# Patient Record
Sex: Female | Born: 1970
Health system: Southern US, Community
[De-identification: ages and names within clinical notes are randomized; demographics above are authoritative.]

## PROBLEM LIST (undated history)

## (undated) DIAGNOSIS — K219 Gastro-esophageal reflux disease without esophagitis: Secondary | ICD-10-CM

## (undated) DIAGNOSIS — F32A Depression, unspecified: Secondary | ICD-10-CM

## (undated) DIAGNOSIS — D649 Anemia, unspecified: Secondary | ICD-10-CM

## (undated) DIAGNOSIS — F419 Anxiety disorder, unspecified: Secondary | ICD-10-CM

## (undated) DIAGNOSIS — R7303 Prediabetes: Secondary | ICD-10-CM

## (undated) DIAGNOSIS — N200 Calculus of kidney: Secondary | ICD-10-CM

## (undated) HISTORY — DX: Gastro-esophageal reflux disease without esophagitis: K21.9

## (undated) HISTORY — DX: Anemia, unspecified: D64.9

## (undated) HISTORY — DX: Anxiety disorder, unspecified: F41.9

## (undated) HISTORY — PX: TUBAL LIGATION: SHX77

## (undated) HISTORY — DX: Prediabetes: R73.03

## (undated) HISTORY — DX: Depression, unspecified: F32.A

---

## 2016-03-20 DIAGNOSIS — F329 Major depressive disorder, single episode, unspecified: Secondary | ICD-10-CM | POA: Insufficient documentation

## 2016-03-20 DIAGNOSIS — F32A Depression, unspecified: Secondary | ICD-10-CM | POA: Insufficient documentation

## 2016-03-20 DIAGNOSIS — E785 Hyperlipidemia, unspecified: Secondary | ICD-10-CM | POA: Insufficient documentation

## 2016-03-20 DIAGNOSIS — E119 Type 2 diabetes mellitus without complications: Secondary | ICD-10-CM | POA: Insufficient documentation

## 2016-03-20 DIAGNOSIS — F3341 Major depressive disorder, recurrent, in partial remission: Secondary | ICD-10-CM | POA: Insufficient documentation

## 2016-03-20 DIAGNOSIS — F419 Anxiety disorder, unspecified: Secondary | ICD-10-CM | POA: Insufficient documentation

## 2017-01-29 ENCOUNTER — Ambulatory Visit
Admission: EM | Admit: 2017-01-29 | Discharge: 2017-01-29 | Disposition: A | Discharge status: Home / Self Care | Payer: Self-pay | Attending: Emergency Medicine | Admitting: Emergency Medicine

## 2017-01-29 ENCOUNTER — Encounter: Payer: Self-pay | Admitting: *Deleted

## 2017-01-29 DIAGNOSIS — R059 Cough, unspecified: Secondary | ICD-10-CM

## 2017-01-29 DIAGNOSIS — R05 Cough: Secondary | ICD-10-CM

## 2017-01-29 DIAGNOSIS — J0110 Acute frontal sinusitis, unspecified: Secondary | ICD-10-CM

## 2017-01-29 HISTORY — DX: Calculus of kidney: N20.0

## 2017-01-29 LAB — RAPID STREP SCREEN (MED CTR MEBANE ONLY): Streptococcus, Group A Screen (Direct): NEGATIVE

## 2017-01-29 MED ORDER — ALBUTEROL SULFATE HFA 108 (90 BASE) MCG/ACT IN AERS: 2.0000 | INHALATION_SPRAY | Freq: Four times a day (QID) | RESPIRATORY_TRACT | 0 refills | Status: DC | PRN

## 2017-01-29 MED ORDER — AMOXICILLIN-POT CLAVULANATE 875-125 MG PO TABS: 1.0000 | ORAL_TABLET | Freq: Two times a day (BID) | ORAL | 0 refills | Status: AC

## 2017-01-29 MED ORDER — BENZONATATE 100 MG PO CAPS: 100.0000 mg | ORAL_CAPSULE | Freq: Three times a day (TID) | ORAL | 0 refills | Status: DC | PRN

## 2017-01-29 MED ORDER — HYDROCOD POLST-CPM POLST ER 10-8 MG/5ML PO SUER: 5.0000 mL | Freq: Every evening | ORAL | 0 refills | Status: DC | PRN

## 2017-01-29 NOTE — ED Triage Notes (Signed)
Productive cough- clear, sore throat, chills, body aches, fever, x1 week.

## 2017-01-29 NOTE — ED Provider Notes (Signed)
CSN: [id number]     Arrival date & time 01/29/17  1727 History   First MD Initiated Contact with Patient 01/29/17 1758     Chief Complaint  Patient presents with  . Cough  . Fever  . Chills  . Generalized Body Aches  . Sore Throat   (Consider location/radiation/quality/duration/timing/severity/associated sxs/prior Treatment) 46 year old female presents with nasal congestion and coughing for over 1 week. Started with fever, sore throat and body aches which resolved within the first 3 days. Now having more sinus pressure and headaches, coughing with clear to discolored mucus and chest tightness/wheezing. Denies any GI symptoms. Has tried Advil and Nyquil with no relief. Having difficulty sleeping at night due to cough. No other family members ill. Has used Albuterol inhaler in the past when she has been ill with good success but no definitive history of allergies/asthma. Does not smoke. No other chronic health issues. Takes no daily medication.    The history is provided by the patient.    Past Medical History:  Diagnosis Date  . Kidney stone    History reviewed. No pertinent surgical history. History reviewed. No pertinent family history. Social History  Substance Use Topics  . Smoking status: Never Smoker  . Smokeless tobacco: Never Used  . Alcohol use Yes   OB History    No data available     Review of Systems  Constitutional: Positive for fatigue and fever. Negative for activity change, appetite change and chills.  HENT: Positive for congestion, postnasal drip, rhinorrhea, sinus pressure and sore throat. Negative for ear discharge, ear pain, facial swelling, mouth sores, nosebleeds, sinus pain, sneezing and trouble swallowing.   Eyes: Negative for discharge and itching.  Respiratory: Positive for cough, chest tightness and wheezing. Negative for shortness of breath.   Cardiovascular: Negative for chest pain.  Gastrointestinal: Negative for abdominal pain, diarrhea, nausea  and vomiting.  Musculoskeletal: Negative for arthralgias, back pain, myalgias, neck pain and neck stiffness.  Skin: Negative for rash and wound.  Allergic/Immunologic: Negative for immunocompromised state.  Neurological: Positive for headaches. Negative for dizziness, syncope, weakness, light-headedness and numbness.  Hematological: Negative for adenopathy. Does not bruise/bleed easily.    Allergies  Patient has no known allergies.  Home Medications   Prior to Admission medications   Medication Sig Start Date End Date Taking? Authorizing Provider  albuterol (PROVENTIL HFA;VENTOLIN HFA) 108 (90 Base) MCG/ACT inhaler Inhale 2 puffs into the lungs every 6 (six) hours as needed for wheezing or shortness of breath. 01/29/17   Sudie GrumblingAmyot, Tamani Durney Berry, NP  amoxicillin-clavulanate (AUGMENTIN) 875-125 MG tablet Take 1 tablet by mouth every 12 (twelve) hours. 01/29/17 02/05/17  Sudie GrumblingAmyot, Caila Cirelli Berry, NP  benzonatate (TESSALON) 100 MG capsule Take 1 capsule (100 mg total) by mouth 3 (three) times daily as needed for cough. 01/29/17   Sudie GrumblingAmyot, Joelene Barriere Berry, NP  chlorpheniramine-HYDROcodone (TUSSIONEX PENNKINETIC ER) 10-8 MG/5ML SUER Take 5 mLs by mouth at bedtime as needed for cough. 01/29/17   Sudie GrumblingAmyot, Shepard Keltz Berry, NP   Meds Ordered and Administered this Visit  Medications - No data to display  BP 121/78 (BP Location: Left Arm)   Pulse 94   Temp 98.4 F (36.9 C) (Oral)   Resp 16   Ht 5\' 2"  (1.575 m)   Wt 203 lb (92.1 kg)   LMP 01/07/2017 (Exact Date)   SpO2 97%   BMI 37.13 kg/m  No data found.   Physical Exam  Constitutional: She is oriented to person, place, and time. Vital  signs are normal. She appears well-developed and well-nourished. No distress.  HENT:  Head: Normocephalic and atraumatic.  Right Ear: Hearing, external ear and ear canal normal. Tympanic membrane is bulging.  Left Ear: Hearing, external ear and ear canal normal. Tympanic membrane is bulging.  Nose: Mucosal edema and rhinorrhea present.  Right sinus exhibits frontal sinus tenderness. Right sinus exhibits no maxillary sinus tenderness. Left sinus exhibits frontal sinus tenderness. Left sinus exhibits no maxillary sinus tenderness.  Mouth/Throat: Uvula is midline and mucous membranes are normal. Posterior oropharyngeal erythema present.  Neck: Normal range of motion. Neck supple.  Cardiovascular: Normal rate, regular rhythm and normal heart sounds.   No murmur heard. Pulmonary/Chest: Effort normal. No respiratory distress. She has decreased breath sounds in the right upper field, the right lower field, the left upper field and the left lower field. She has wheezes in the right upper field and the left upper field. She has no rhonchi. She has no rales.  Musculoskeletal: Normal range of motion.  Lymphadenopathy:    She has no cervical adenopathy.  Neurological: She is alert and oriented to person, place, and time.  Skin: Skin is warm and dry.  Psychiatric: She has a normal mood and affect. Her behavior is normal. Judgment and thought content normal.    Urgent Care Course     Procedures (including critical care time)  Labs Review Labs Reviewed  RAPID STREP SCREEN (NOT AT [facility])  CULTURE, GROUP A STREP [facility])    Imaging Review No results found.   Visual Acuity Review  Right Eye Distance:   Left Eye Distance:   Bilateral Distance:    Right Eye Near:   Left Eye Near:    Bilateral Near:         MDM   1. Acute non-recurrent frontal sinusitis   2. Cough    Reviewed negative rapid strep test result with patient. Discussed that she probably has a bacterial sinus infection and cough due to irritation from drainage. Recommend start Augmentin 875mg  twice a day as directed. May take Tessalon cough pills 1 every 8 hours as needed. May use Tussionex 1 teaspoon at night to help with cough. [place] Controlled Substance Registry Reviewed- no active Rx in the past 6 months. Recommend increase fluid intake to help loosen up mucus.  Use Albuterol inhaler 2 puffs every 6 hours as needed. Follow-up in 4 to 5 days if not improving.      Sudie Grumbling, NP 01/29/17 [phone]

## 2017-01-29 NOTE — Discharge Instructions (Signed)
Recommend start Augmentin 875mg  twice a day as directed. May take Tessalon cough pills 1 every 8 hours as needed. May use Tussionex 1 teaspoon at night to help with cough. Increase fluid intake to help loosen up mucus. Use Albuterol inhaler 2 puffs every 6 hours as needed. Follow-up in 4 to 5 days if not improving.

## 2017-02-01 LAB — CULTURE, GROUP A STREP (THRC)

## 2018-11-09 ENCOUNTER — Ambulatory Visit: Payer: Self-pay | Admitting: Family Medicine

## 2018-11-10 ENCOUNTER — Ambulatory Visit: Payer: Self-pay | Admitting: Family Medicine

## 2019-01-20 ENCOUNTER — Telehealth: Payer: Self-pay | Admitting: Family Medicine

## 2019-01-20 ENCOUNTER — Ambulatory Visit: Payer: BC Managed Care – PPO | Admitting: Family Medicine

## 2019-01-20 ENCOUNTER — Encounter: Payer: Self-pay | Admitting: Family Medicine

## 2019-01-20 ENCOUNTER — Ambulatory Visit
Admission: RE | Admit: 2019-01-20 | Discharge: 2019-01-20 | Disposition: A | Discharge status: Home / Self Care | Payer: BC Managed Care – PPO | Source: Ambulatory Visit | Attending: Family Medicine | Admitting: Family Medicine

## 2019-01-20 ENCOUNTER — Other Ambulatory Visit: Payer: Self-pay

## 2019-01-20 ENCOUNTER — Ambulatory Visit
Admission: RE | Admit: 2019-01-20 | Discharge: 2019-01-20 | Disposition: A | Discharge status: Home / Self Care | Payer: BC Managed Care – PPO | Attending: Family Medicine | Admitting: Family Medicine

## 2019-01-20 VITALS — BP 110/80 | HR 80 | Ht 62.0 in | Wt 224.0 lb

## 2019-01-20 DIAGNOSIS — E119 Type 2 diabetes mellitus without complications: Secondary | ICD-10-CM

## 2019-01-20 DIAGNOSIS — F419 Anxiety disorder, unspecified: Secondary | ICD-10-CM | POA: Diagnosis not present

## 2019-01-20 DIAGNOSIS — Z7689 Persons encountering health services in other specified circumstances: Secondary | ICD-10-CM | POA: Diagnosis not present

## 2019-01-20 DIAGNOSIS — M19072 Primary osteoarthritis, left ankle and foot: Secondary | ICD-10-CM | POA: Diagnosis not present

## 2019-01-20 DIAGNOSIS — M779 Enthesopathy, unspecified: Secondary | ICD-10-CM | POA: Insufficient documentation

## 2019-01-20 MED ORDER — MELOXICAM 7.5 MG PO TABS: 7.5000 mg | ORAL_TABLET | Freq: Every day | ORAL | 0 refills | Status: DC

## 2019-01-20 NOTE — Progress Notes (Signed)
Date:  01/20/2019   Name:  Beth [name]   DOB:  12/29/1970   MRN:  [id number]   Chief Complaint: Establish Care (hasn't had insurance); Anxiety (referral to psych- PHQ9=5 GAD7=13); and Foot Pain (hurts across top of foot)  Patient is a 48 year old female who presents for an establish care exam. The patient reports the following problems: anxiety/foot pain. Health maintenance has been reviewed up to date.  Anxiety  Presents for follow-up visit. Symptoms include excessive worry, nervous/anxious behavior and panic. Patient reports no chest pain, compulsions, confusion, decreased concentration, depressed mood, dizziness, dry mouth, feeling of choking, hyperventilation, impotence, insomnia, irritability, malaise, muscle tension, nausea, obsessions, palpitations, restlessness, shortness of breath or suicidal ideas. Symptoms occur occasionally. The severity of symptoms is moderate.    Foot Pain  This is a new (talked into hiking) problem. The current episode started more than 1 month ago (about a month ago). The problem occurs constantly. The problem has been unchanged (not getting better). Associated symptoms include arthralgias. Pertinent negatives include no abdominal pain, chest pain, chills, congestion, coughing, fatigue, fever, headaches, myalgias, nausea, numbness, rash, sore throat, vomiting or weakness.    Review of Systems  Constitutional: Negative.  Negative for chills, fatigue, fever, irritability and unexpected weight change.  HENT: Negative for congestion, ear discharge, ear pain, rhinorrhea, sinus pressure, sneezing and sore throat.   Eyes: Negative for photophobia, pain, discharge, redness and itching.  Respiratory: Negative for cough, shortness of breath, wheezing and stridor.   Cardiovascular: Negative for chest pain and palpitations.  Gastrointestinal: Negative for abdominal pain, blood in stool, constipation, diarrhea, nausea and vomiting.  Endocrine: Negative for cold  intolerance, heat intolerance, polydipsia, polyphagia and polyuria.  Genitourinary: Negative for dysuria, flank pain, frequency, hematuria, impotence, menstrual problem, pelvic pain, urgency, vaginal bleeding and vaginal discharge.  Musculoskeletal: Positive for arthralgias. Negative for back pain and myalgias.  Skin: Negative for rash.  Allergic/Immunologic: Negative for environmental allergies and food allergies.  Neurological: Negative for dizziness, weakness, light-headedness, numbness and headaches.  Hematological: Negative for adenopathy. Does not bruise/bleed easily.  Psychiatric/Behavioral: Negative for confusion, decreased concentration, dysphoric mood and suicidal ideas. The patient is nervous/anxious. The patient does not have insomnia.     Patient Active Problem List   Diagnosis Date Noted  . Anxiety 03/20/2016  . Depression 03/20/2016  . Diabetes mellitus type 2, uncomplicated (HCC) 03/20/2016  . Hyperlipidemia, unspecified 03/20/2016    No Known Allergies  History reviewed. No pertinent surgical history.  Social History   Tobacco Use  . Smoking status: Never Smoker  . Smokeless tobacco: Never Used  Substance Use Topics  . Alcohol use: Yes  . Drug use: No     Medication list has been reviewed and updated.  Current Meds  Medication Sig  . albuterol (PROVENTIL HFA;VENTOLIN HFA) 108 (90 Base) MCG/ACT inhaler Inhale 2 puffs into the lungs every 6 (six) hours as needed for wheezing or shortness of breath.  . clonazePAM (KLONOPIN) 0.5 MG tablet Take 1 tablet by mouth as needed.    PHQ 2/9 Scores 01/20/2019  PHQ - 2 Score 2  PHQ- 9 Score 5    BP Readings from Last 3 Encounters:  01/20/19 110/80  01/29/17 121/78    Physical Exam  Wt Readings from Last 3 Encounters:  01/20/19 224 lb (101.6 kg)  01/29/17 203 lb (92.1 kg)    BP 110/80   Pulse 80   Ht 5\' 2"  (1.575 m)   Wt 224 lb (101.6 kg)  LMP 11/24/2018 (Approximate) Comment: irregular periods  BMI  40.97 kg/m   Assessment and Plan:  1. Establishing care with new doctor, encounter for Establishment of care with new physician.  Previous encounters were reviewed as well as labs and imaging.  2. Type 2 diabetes mellitus without complication, without long-term current use of insulin (HCC) Patient has a history of prediabetes we will check an A1c for baseline and then address accordingly. - Hemoglobin A1c  3. Tendonitis After a walk of about 3 to 5 miles patient sustained some discomfort of her left foot across the dorsum of her foot and her ankle.  This is been persistent for several weeks.  It is unlikely there is a fracture but there may be a stress fracture given that patient is overweight and this was over uneven terrain.  Will check an x-ray to rule out fracture and initiate meloxicam. - DG Foot Complete Left; Future - meloxicam (MOBIC) 7.5 MG tablet; Take 1 tablet (7.5 mg total) by mouth daily.  Dispense: 30 tablet; Refill: 0  4. Anxiety Patient has a history of anxiety with a gad 7 score of 13.  Will refer to psychiatry for evaluation and treatment.  Told patient it would be unlikely that we would sustain a long-term benzodiazepine and the possibility of a SSRI is likely.  And has tolerated Prozac in the past but stopped on her own. - Ambulatory referral to Psychiatry

## 2019-01-20 NOTE — Telephone Encounter (Signed)
Not until we get her foot in better shape, all she will do is aggravate it

## 2019-01-20 NOTE — Telephone Encounter (Signed)
Patient would like to know if she can go walking.

## 2019-01-21 LAB — HEMOGLOBIN A1C
Est. average glucose Bld gHb Est-mCnc: 114 mg/dL
Hgb A1c MFr Bld: 5.6 % (ref 4.8–5.6)

## 2019-02-08 DIAGNOSIS — F331 Major depressive disorder, recurrent, moderate: Secondary | ICD-10-CM | POA: Diagnosis not present

## 2019-02-08 DIAGNOSIS — F411 Generalized anxiety disorder: Secondary | ICD-10-CM | POA: Diagnosis not present

## 2019-03-08 DIAGNOSIS — F411 Generalized anxiety disorder: Secondary | ICD-10-CM | POA: Diagnosis not present

## 2019-03-08 DIAGNOSIS — F331 Major depressive disorder, recurrent, moderate: Secondary | ICD-10-CM | POA: Diagnosis not present

## 2019-04-14 ENCOUNTER — Other Ambulatory Visit: Payer: Self-pay | Admitting: Family Medicine

## 2019-04-14 DIAGNOSIS — M779 Enthesopathy, unspecified: Secondary | ICD-10-CM

## 2019-05-15 ENCOUNTER — Other Ambulatory Visit: Payer: Self-pay | Admitting: Family Medicine

## 2019-05-15 DIAGNOSIS — M779 Enthesopathy, unspecified: Secondary | ICD-10-CM

## 2019-05-18 DIAGNOSIS — F331 Major depressive disorder, recurrent, moderate: Secondary | ICD-10-CM | POA: Diagnosis not present

## 2019-05-18 DIAGNOSIS — F411 Generalized anxiety disorder: Secondary | ICD-10-CM | POA: Diagnosis not present

## 2019-05-24 DIAGNOSIS — F331 Major depressive disorder, recurrent, moderate: Secondary | ICD-10-CM | POA: Diagnosis not present

## 2019-05-24 DIAGNOSIS — F411 Generalized anxiety disorder: Secondary | ICD-10-CM | POA: Diagnosis not present

## 2020-01-25 ENCOUNTER — Ambulatory Visit
Admission: EM | Admit: 2020-01-25 | Discharge: 2020-01-25 | Disposition: A | Discharge status: Home / Self Care | Payer: BC Managed Care – PPO | Attending: Emergency Medicine | Admitting: Emergency Medicine

## 2020-01-25 ENCOUNTER — Other Ambulatory Visit: Payer: Self-pay

## 2020-01-25 DIAGNOSIS — R059 Cough, unspecified: Secondary | ICD-10-CM

## 2020-01-25 DIAGNOSIS — K219 Gastro-esophageal reflux disease without esophagitis: Secondary | ICD-10-CM

## 2020-01-25 DIAGNOSIS — J019 Acute sinusitis, unspecified: Secondary | ICD-10-CM | POA: Diagnosis not present

## 2020-01-25 DIAGNOSIS — R05 Cough: Secondary | ICD-10-CM | POA: Diagnosis not present

## 2020-01-25 MED ORDER — HYDROCOD POLST-CPM POLST ER 10-8 MG/5ML PO SUER: 5.0000 mL | Freq: Two times a day (BID) | ORAL | 0 refills | Status: DC | PRN

## 2020-01-25 MED ORDER — AMOXICILLIN-POT CLAVULANATE 875-125 MG PO TABS: 1.0000 | ORAL_TABLET | Freq: Two times a day (BID) | ORAL | 0 refills | Status: DC

## 2020-01-25 MED ORDER — FLUTICASONE PROPIONATE 50 MCG/ACT NA SUSP: 2.0000 | Freq: Every day | NASAL | 0 refills | Status: DC

## 2020-01-25 MED ORDER — FAMOTIDINE 20 MG PO TABS: 20.0000 mg | ORAL_TABLET | Freq: Two times a day (BID) | ORAL | 0 refills | Status: DC

## 2020-01-25 MED ORDER — FAMOTIDINE 20 MG PO TABS: 20.0000 mg | ORAL_TABLET | Freq: Two times a day (BID) | ORAL | 0 refills | Status: AC

## 2020-01-25 NOTE — ED Triage Notes (Signed)
Patient complains of cough, nasal congestion x 2 weeks ago. Patient states that cough is worse at night.

## 2020-01-25 NOTE — ED Provider Notes (Signed)
HPI  SUBJECTIVE:  Beth [name] is a 49 y.o. female who presents with URI symptoms for the past 2 weeks.  She reports nonproductive cough, chest tightness, wheezing worse at night.  She reports postnasal drip, headache, burning sore throat secondary to the cough.  One episode of posttussive emesis.  She reports having GERD symptoms at night.  She reports greenish nasal discharge streaked with blood.  No fevers, sinus pain or pressure, facial swelling, upper dental pain, body aches, loss of sense of smell or taste, shortness of breath, nausea, diarrhea, abdominal pain.  No allergy symptoms.  No antibiotics in the past month.  No antipyretic in the past 4 to 6 hours.  States that she is unable to sleep at night secondary to the cough.  States that she was getting better but then got acutely worse.  She has been taking NyQuil and DayQuil with temporary relief.  Symptoms are worse with lying down at night.  She has a past medical history of GERD and is not on any medications for this.  No history of allergies, frequent sinusitis, diabetes, hypertension, pulmonary disease, smoking.  LMP: 5/16.  Denies the possibility being pregnant.  PMD: None.    Past Medical History:  Diagnosis Date  . Anxiety   . Borderline diabetes   . Kidney stone    kidney stone    History reviewed. No pertinent surgical history.  Family History  Problem Relation Age of Onset  . Diabetes Mother   . Hypertension Mother   . Diabetes Father   . Hypertension Father   . Diabetes Maternal Grandmother   . Heart disease Maternal Grandmother   . Diabetes Maternal Grandfather   . Diabetes Paternal Grandmother   . Diabetes Paternal Grandfather     Social History   Tobacco Use  . Smoking status: Never Smoker  . Smokeless tobacco: Never Used  Substance Use Topics  . Alcohol use: Yes    Comment: occasionally  . Drug use: No    No current facility-administered medications for this encounter.  Current Outpatient  Medications:  .  amoxicillin-clavulanate (AUGMENTIN) 875-125 MG tablet, Take 1 tablet by mouth 2 (two) times daily. X 7 days, Disp: 14 tablet, Rfl: 0 .  chlorpheniramine-HYDROcodone (TUSSIONEX PENNKINETIC ER) 10-8 MG/5ML SUER, Take 5 mLs by mouth every 12 (twelve) hours as needed for cough., Disp: 60 mL, Rfl: 0 .  famotidine (PEPCID) 20 MG tablet, Take 1 tablet (20 mg total) by mouth 2 (two) times daily., Disp: 60 tablet, Rfl: 0 .  fluticasone (FLONASE) 50 MCG/ACT nasal spray, Place 2 sprays into both nostrils daily., Disp: 16 g, Rfl: 0  No Known Allergies   ROS  As noted in HPI.   Physical Exam  BP 110/78 (BP Location: Left Arm)   Pulse 91   Temp 98.4 F (36.9 C) (Oral)   Resp 18   Ht 5\' 2"  (1.575 m)   Wt 102.1 kg   LMP 12/26/2019   SpO2 97%   BMI 41.15 kg/m   Constitutional: Well developed, well nourished, no acute distress Eyes:  EOMI, conjunctiva normal bilaterally HENT: Normocephalic, atraumatic,mucus membranes moist.  Erythematous, swollen turbinates.  Positive nasal congestion.  No maxillary, frontal sinus tenderness.  Normal oropharynx with no postnasal drip or cobblestoning. Respiratory: Normal inspiratory effort lungs clear bilaterally, good air movement.  No anterior lateral chest wall tenderness Cardiovascular: Normal rate regular rhythm no murmurs rubs or gallops GI: nondistended skin: No rash, skin intact Musculoskeletal: no deformities Neurologic: Alert & oriented  x 3, no focal neuro deficits Psychiatric: Speech and behavior appropriate   ED Course   Medications - No data to display  No orders of the defined types were placed in this encounter.   No results found for this or any previous visit (from the past 24 hour(s)). No results found.  ED Clinical Impression  1. Acute non-recurrent sinusitis, unspecified location   2. Cough   3. Gastroesophageal reflux disease, unspecified whether esophagitis present      ED Assessment/Plan  [place] Narcotic  database reviewed for this patient, and feel that the risk/benefit ratio today is favorable for proceeding with a prescription for controlled substance.  No opiate prescriptions in 2 years.  Suspect sinusitis versus acid reflux causing her cough.  She has had URI symptoms for the past 2 weeks and with a history of double sickening, will treat for sinusitis with Augmentin, Flonase, Mucinex D, saline nasal irrigation.  She also states her acid reflux is worse at night and her symptoms are worse at night. will start her on some Pepcid.  Tussionex for the cough at night.  Will provide primary care list for ongoing care.  Lungs are clear, no fevers, satting well room air.  Deferring chest x-ray.  Doubt Covid given duration of symptoms.  Discussed  MDM, treatment plan, and plan for follow-up with patient. Discussed sn/sx that should prompt return to the ED. patient agrees with plan.   Meds ordered this encounter  Medications  . fluticasone (FLONASE) 50 MCG/ACT nasal spray    Sig: Place 2 sprays into both nostrils daily.    Dispense:  16 g    Refill:  0  . amoxicillin-clavulanate (AUGMENTIN) 875-125 MG tablet    Sig: Take 1 tablet by mouth 2 (two) times daily. X 7 days    Dispense:  14 tablet    Refill:  0  . DISCONTD: famotidine (PEPCID) 20 MG tablet    Sig: Take 1 tablet (20 mg total) by mouth 2 (two) times daily.    Dispense:  60 tablet    Refill:  0  . chlorpheniramine-HYDROcodone (TUSSIONEX PENNKINETIC ER) 10-8 MG/5ML SUER    Sig: Take 5 mLs by mouth every 12 (twelve) hours as needed for cough.    Dispense:  60 mL    Refill:  0  . famotidine (PEPCID) 20 MG tablet    Sig: Take 1 tablet (20 mg total) by mouth 2 (two) times daily.    Dispense:  60 tablet    Refill:  0    *This clinic note was created using Scientist, clinical (histocompatibility and immunogenetics). Therefore, there may be occasional mistakes despite careful proofreading.   ?    Domenick Gong, MD 01/25/20 1050

## 2020-01-25 NOTE — Discharge Instructions (Addendum)
Augmentin, Flonase, Mucinex D, saline nasal irrigation with a Lloyd Huger med rinse and distilled water as often as you want for the sinusitis.  Pepcid for the acid reflux.  Mind your food choices and elevate the head of your bed 30 degrees.  Tussionex for the cough at night, but I think once we get your sinuses and acid reflux under control you will not need the Tussionex.  Here is a list of primary care providers who are taking new patients:  Dr. Elizabeth Sauer [address] [place] [address] [phone]  [facility] [address] [place] [address]  [phone]  [facility] [address] [place], [place] [address] [phone]  [facility] [address] [place]  [phone] [place], [place] [address]  Here are clinics/ other resources who will see you if you do not have insurance. Some have certain criteria that you must meet. Call them and find out what they are:  [facility]: [address] [place], [place] [address] Phone: [phone] Hours: First and Third Saturdays of each Month, 9 a.m. - 1 p.m.  [facility]: [address], [address] [place], [place] [address] Phone: [phone] Hours: Tuesday, 4 p.m. - 8 p.m. Thursday, 1 p.m. - 8 p.m. Wednesday, 9 a.m. - [facility] [address] [place], [place] [address] Phone: [phone] Pharmacy Phone Number: [phone] Dental Phone Number: [phone] [facility] Insurance Help: [phone]  Dental Hours: Monday - Thursday, 8 a.m. - 6 p.m.  [facility] [address] [place], [place] [address] Phone: [phone] Pharmacy Phone Number: [phone] [facility] Insurance Help: [phone]  [facility] [address] [place] [place], [place] [address] Phone: [phone] Pharmacy Phone Number: [phone] [facility] Insurance Help: [phone]  [facility] [address] [place], [place]  [address] Phone: [phone] [facility] Insurance Help: [phone]   [facility] [address] [place], [place] [address] Phone: [phone]  Go to www.goodrx.com to look up your medications. This will give you a list of where you can find your prescriptions at the most affordable prices. Or ask the pharmacist what the cash price is, or if they have any other discount programs available to help make your medication more affordable. This can be less expensive than what you would pay with insurance.

## 2020-04-26 ENCOUNTER — Ambulatory Visit: Payer: BC Managed Care – PPO

## 2020-04-26 ENCOUNTER — Encounter: Payer: Self-pay | Admitting: Sports Medicine

## 2020-04-26 ENCOUNTER — Ambulatory Visit: Payer: BC Managed Care – PPO | Admitting: Sports Medicine

## 2020-04-26 ENCOUNTER — Other Ambulatory Visit: Payer: Self-pay

## 2020-04-26 ENCOUNTER — Ambulatory Visit (INDEPENDENT_AMBULATORY_CARE_PROVIDER_SITE_OTHER): Payer: BC Managed Care – PPO

## 2020-04-26 ENCOUNTER — Other Ambulatory Visit: Payer: Self-pay | Admitting: Sports Medicine

## 2020-04-26 DIAGNOSIS — M722 Plantar fascial fibromatosis: Secondary | ICD-10-CM

## 2020-04-26 DIAGNOSIS — M7750 Other enthesopathy of unspecified foot: Secondary | ICD-10-CM

## 2020-04-26 DIAGNOSIS — M79672 Pain in left foot: Secondary | ICD-10-CM

## 2020-04-26 DIAGNOSIS — M778 Other enthesopathies, not elsewhere classified: Secondary | ICD-10-CM | POA: Diagnosis not present

## 2020-04-26 DIAGNOSIS — M25572 Pain in left ankle and joints of left foot: Secondary | ICD-10-CM | POA: Diagnosis not present

## 2020-04-26 DIAGNOSIS — M25571 Pain in right ankle and joints of right foot: Secondary | ICD-10-CM | POA: Diagnosis not present

## 2020-04-26 DIAGNOSIS — M779 Enthesopathy, unspecified: Secondary | ICD-10-CM | POA: Diagnosis not present

## 2020-04-26 DIAGNOSIS — M79671 Pain in right foot: Secondary | ICD-10-CM

## 2020-04-26 MED ORDER — DICLOFENAC SODIUM 75 MG PO TBEC: 75.0000 mg | DELAYED_RELEASE_TABLET | Freq: Two times a day (BID) | ORAL | 0 refills | Status: DC

## 2020-04-26 MED ORDER — PREDNISONE 10 MG (21) PO TBPK: ORAL_TABLET | ORAL | 0 refills | Status: DC

## 2020-04-26 NOTE — Progress Notes (Signed)
D

## 2020-04-26 NOTE — Progress Notes (Signed)
Subjective:  Beth [name] is a 49 y.o. female patient who presents to office for evaluation of left greater than right ankle pain. Patient complains of continued pain in the ankle that started since pain on the medial aspect that is worse as the day progresses reports that she cannot stand or walk more than 15 or 20 minutes due to the pain and cannot stretch in the mornings due to the pain in her ankle patient reports that since she has been dealing with pain for so long progressively getting worse over the last month now she is starting to have some pain also at her right ankle and her plantar fascia is also been hurting.  Patient reports that she has tried resting icing meloxicam in the past given by PCP that used to help but no longer helps elevating stretching rolling pain and trying to lose weight but her foot pain still persist. Patient denies any other pedal complaints. Denies injury/trip/fall/sprain/any other causative factors.   Review of Systems  All other systems reviewed and are negative.   Patient Active Problem List   Diagnosis Date Noted  . Anxiety 03/20/2016  . Depression 03/20/2016  . Diabetes mellitus type 2, uncomplicated (HCC) 03/20/2016  . Hyperlipidemia, unspecified 03/20/2016    Current Outpatient Medications on File Prior to Visit  Medication Sig Dispense Refill  . amoxicillin-clavulanate (AUGMENTIN) 875-125 MG tablet Take 1 tablet by mouth 2 (two) times daily. X 7 days 14 tablet 0  . chlorpheniramine-HYDROcodone (TUSSIONEX PENNKINETIC ER) 10-8 MG/5ML SUER Take 5 mLs by mouth every 12 (twelve) hours as needed for cough. 60 mL 0  . famotidine (PEPCID) 20 MG tablet Take 1 tablet (20 mg total) by mouth 2 (two) times daily. 60 tablet 0  . fluticasone (FLONASE) 50 MCG/ACT nasal spray Place 2 sprays into both nostrils daily. 16 g 0  . [DISCONTINUED] albuterol (PROVENTIL HFA;VENTOLIN HFA) 108 (90 Base) MCG/ACT inhaler Inhale 2 puffs into the lungs every 6 (six) hours as needed  for wheezing or shortness of breath. 1 Inhaler 0  . [DISCONTINUED] clonazePAM (KLONOPIN) 0.5 MG tablet Take 1 tablet by mouth as needed.     No current facility-administered medications on file prior to visit.    No Known Allergies  Objective:  General: Alert and oriented x3 in no acute distress  Dermatology: No open lesions bilateral lower extremities, no webspace macerations, no ecchymosis bilateral, all nails x 10 are well manicured.  Vascular: Dorsalis Pedis and Posterior Tibial pedal pulses palpable, Capillary Fill Time 3 seconds,(+) pedal hair growth bilateral, no edema bilateral lower extremities, Temperature gradient within normal limits.  Neurology: Gross sensation intact via light touch bilateral.  Lateral. (- )Tinels sign bilateral.   Musculoskeletal: Mild to moderate tenderness with palpation at posterior tibial tendon course of the left ankle greater than the right there is also mild pain at the right plantar fascial insertion extends into the arch on the right. Negative talar tilt, Negative tib-fib stress, No instability. No pain with calf compression bilateral. Range of motion within normal limits with mild guarding on left greater than right ankle.  Pes planus foot type.  Strength within normal limits in all groups bilateral.   Gait: Antalgic gait  Xrays  Left and right ankle   Impression: Normal osseous mineralization, there is midtarsal breech supportive of pes planus deformity, very minimal calcaneal heel spur right greater than left.  Assessment and Plan: Problem List Items Addressed This Visit    None    Visit Diagnoses  Acute bilateral ankle pain    -  Primary   Tendonitis       Pain in left foot       Relevant Orders   DG Ankle Complete Left   Pain in right foot       Relevant Orders   DG Ankle Complete Right   Plantar fasciitis, right       Foot pain, bilateral           -Complete examination performed -Xrays reviewed -Discussed treatement  options for likely tendinitis bilateral with compensation foot pain -Rx prednisone Dosepak and diclofenac to take as instructed -Dispensed plantar fascial braces bilateral -Recommend rest, ice, and elevation  -Patient to return to office in 1 month or sooner if condition worsens.  Advised patient if symptoms fail to improve may benefit from Cam boot versus custom orthotics for long-term benefit.  Asencion Islam, DPM

## 2020-05-24 ENCOUNTER — Ambulatory Visit: Payer: BC Managed Care – PPO | Admitting: Sports Medicine

## 2020-05-24 ENCOUNTER — Other Ambulatory Visit: Payer: Self-pay

## 2020-05-24 ENCOUNTER — Encounter: Payer: Self-pay | Admitting: Sports Medicine

## 2020-05-24 DIAGNOSIS — M79671 Pain in right foot: Secondary | ICD-10-CM | POA: Diagnosis not present

## 2020-05-24 DIAGNOSIS — M25571 Pain in right ankle and joints of right foot: Secondary | ICD-10-CM

## 2020-05-24 DIAGNOSIS — M25572 Pain in left ankle and joints of left foot: Secondary | ICD-10-CM

## 2020-05-24 DIAGNOSIS — M79672 Pain in left foot: Secondary | ICD-10-CM | POA: Diagnosis not present

## 2020-05-24 DIAGNOSIS — M722 Plantar fascial fibromatosis: Secondary | ICD-10-CM

## 2020-05-24 DIAGNOSIS — M779 Enthesopathy, unspecified: Secondary | ICD-10-CM | POA: Diagnosis not present

## 2020-05-24 MED ORDER — DICLOFENAC SODIUM 75 MG PO TBEC: 75.0000 mg | DELAYED_RELEASE_TABLET | Freq: Two times a day (BID) | ORAL | 0 refills | Status: DC

## 2020-05-24 NOTE — Progress Notes (Signed)
Subjective:  Beth [name] is a 49 y.o. female patient who returns to office for follow up evaluation of bilateral foot pain. Patient reports that she is feeling much better. Medications and braces helped. No other issue noted.   Patient Active Problem List   Diagnosis Date Noted  . Anxiety 03/20/2016  . Depression 03/20/2016  . Diabetes mellitus type 2, uncomplicated (HCC) 03/20/2016  . Hyperlipidemia, unspecified 03/20/2016    Current Outpatient Medications on File Prior to Visit  Medication Sig Dispense Refill  . amoxicillin-clavulanate (AUGMENTIN) 875-125 MG tablet Take 1 tablet by mouth 2 (two) times daily. X 7 days 14 tablet 0  . chlorpheniramine-HYDROcodone (TUSSIONEX PENNKINETIC ER) 10-8 MG/5ML SUER Take 5 mLs by mouth every 12 (twelve) hours as needed for cough. 60 mL 0  . diclofenac (VOLTAREN) 75 MG EC tablet Take 1 tablet (75 mg total) by mouth 2 (two) times daily. 30 tablet 0  . famotidine (PEPCID) 20 MG tablet Take 1 tablet (20 mg total) by mouth 2 (two) times daily. 60 tablet 0  . fluticasone (FLONASE) 50 MCG/ACT nasal spray Place 2 sprays into both nostrils daily. 16 g 0  . predniSONE (STERAPRED UNI-PAK 21 TAB) 10 MG (21) TBPK tablet Take as directed 21 tablet 0  . [DISCONTINUED] albuterol (PROVENTIL HFA;VENTOLIN HFA) 108 (90 Base) MCG/ACT inhaler Inhale 2 puffs into the lungs every 6 (six) hours as needed for wheezing or shortness of breath. 1 Inhaler 0  . [DISCONTINUED] clonazePAM (KLONOPIN) 0.5 MG tablet Take 1 tablet by mouth as needed.     No current facility-administered medications on file prior to visit.    No Known Allergies  Objective:  General: Alert and oriented x3 in no acute distress  Dermatology: No open lesions bilateral lower extremities, no webspace macerations, no ecchymosis bilateral, all nails x 10 are well manicured.  Vascular: Dorsalis Pedis and Posterior Tibial pedal pulses palpable, Capillary Fill Time 3 seconds,(+) pedal hair growth bilateral,  no edema bilateral lower extremities, Temperature gradient within normal limits.  Neurology: Gross sensation intact via light touch bilateral.  Lateral. (- )Tinels sign bilateral.   Musculoskeletal: Decreased tenderness with palpation at posterior tibial tendon course of the left ankle greater than the right there is also much improved pain at the right plantar fascial insertion extends into the arch on the right. Negative talar tilt, Negative tib-fib stress, No instability. No pain with calf compression bilateral. Range of motion within normal limits with mild guarding on left greater than right ankle.  Pes planus foot type.  Strength within normal limits in all groups bilateral.   Assessment and Plan: Problem List Items Addressed This Visit    None    Visit Diagnoses    Acute bilateral ankle pain    -  Primary   Tendonitis       Pain in left foot       Pain in right foot       Plantar fasciitis, right       Foot pain, bilateral           -Complete examination performed -Re-discussed treatement options for likely tendinitis bilateral with compensation foot pain secondary to plantar fasciitis -Refilled diclofenac to take as instructed -Continue with plantar fascial braces bilateral -Refill diclofenac to check for any residual pain -Recommend rest, ice, and elevation  -Patient to return to for orthotics with Wallene Dales, DPM

## 2020-06-11 ENCOUNTER — Ambulatory Visit (INDEPENDENT_AMBULATORY_CARE_PROVIDER_SITE_OTHER): Payer: BC Managed Care – PPO | Admitting: Orthotics

## 2020-06-11 ENCOUNTER — Other Ambulatory Visit: Payer: Self-pay

## 2020-06-11 DIAGNOSIS — M25571 Pain in right ankle and joints of right foot: Secondary | ICD-10-CM

## 2020-06-11 DIAGNOSIS — M779 Enthesopathy, unspecified: Secondary | ICD-10-CM

## 2020-06-11 DIAGNOSIS — M25572 Pain in left ankle and joints of left foot: Secondary | ICD-10-CM | POA: Diagnosis not present

## 2020-06-11 DIAGNOSIS — M722 Plantar fascial fibromatosis: Secondary | ICD-10-CM | POA: Diagnosis not present

## 2020-06-11 NOTE — Progress Notes (Signed)
Patient is being seen today for f/o to address congential pes planus/pes planovalgus. Patient is active youth and demonstrates over pronation in gait, prominent medially shifted talus, and collapse of medial column.  Goals are RF stability, longitudinal arch support, decrease in pronation, and ease of discomfort in mobility related activities.   

## 2020-07-10 ENCOUNTER — Other Ambulatory Visit: Payer: Self-pay

## 2020-07-10 ENCOUNTER — Ambulatory Visit: Payer: BC Managed Care – PPO | Admitting: Orthotics

## 2020-07-10 DIAGNOSIS — M25571 Pain in right ankle and joints of right foot: Secondary | ICD-10-CM

## 2020-07-10 DIAGNOSIS — M25572 Pain in left ankle and joints of left foot: Secondary | ICD-10-CM

## 2020-07-10 NOTE — Progress Notes (Signed)
Patient picked up f/o and was pleased with fit, comfort, and function.  Worked well with footwear.  Told of rbeak in period and how to report any issues.  

## 2021-06-10 ENCOUNTER — Ambulatory Visit
Admission: EM | Admit: 2021-06-10 | Discharge: 2021-06-10 | Disposition: A | Discharge status: Home / Self Care | Payer: BC Managed Care – PPO

## 2021-06-10 ENCOUNTER — Encounter: Payer: Self-pay | Admitting: Emergency Medicine

## 2021-06-10 ENCOUNTER — Other Ambulatory Visit: Payer: Self-pay

## 2021-06-10 DIAGNOSIS — R1084 Generalized abdominal pain: Secondary | ICD-10-CM | POA: Diagnosis not present

## 2021-06-10 DIAGNOSIS — E1122 Type 2 diabetes mellitus with diabetic chronic kidney disease: Secondary | ICD-10-CM | POA: Diagnosis not present

## 2021-06-10 DIAGNOSIS — R1033 Periumbilical pain: Secondary | ICD-10-CM | POA: Diagnosis not present

## 2021-06-10 DIAGNOSIS — N182 Chronic kidney disease, stage 2 (mild): Secondary | ICD-10-CM | POA: Diagnosis not present

## 2021-06-10 DIAGNOSIS — Z20822 Contact with and (suspected) exposure to covid-19: Secondary | ICD-10-CM | POA: Diagnosis not present

## 2021-06-10 DIAGNOSIS — K436 Other and unspecified ventral hernia with obstruction, without gangrene: Secondary | ICD-10-CM | POA: Diagnosis not present

## 2021-06-10 DIAGNOSIS — E669 Obesity, unspecified: Secondary | ICD-10-CM | POA: Diagnosis not present

## 2021-06-10 DIAGNOSIS — K439 Ventral hernia without obstruction or gangrene: Secondary | ICD-10-CM | POA: Diagnosis not present

## 2021-06-10 DIAGNOSIS — Z6838 Body mass index (BMI) 38.0-38.9, adult: Secondary | ICD-10-CM | POA: Diagnosis not present

## 2021-06-10 DIAGNOSIS — R112 Nausea with vomiting, unspecified: Secondary | ICD-10-CM | POA: Diagnosis not present

## 2021-06-10 NOTE — Discharge Instructions (Addendum)
I have concern for strangulated hernia and/or bowel obstruction.  This is a serious condition and it is already been going on for a couple of days.  As we discussed it is a medical emergency so advised to the emergency department this time.  Please proceed to [facility] ED.  If you wait any longer, this could rupture and bleed to serious problems including death.  [facility] ED: [address] [place], [place] [address] Phone: [phone]  You have been advised to follow up immediately in the emergency department for concerning signs.symptoms. If you declined EMS transport, please have a family member take you directly to the ED at this time. Do not delay. Based on concerns about condition, if you do not follow up in th e ED, you may risk poor outcomes including worsening of condition, delayed treatment and potentially life threatening issues. If you have declined to go to the ED at this time, you should call your PCP immediately to set up a follow up appointment.  Go to ED for red flag symptoms, including; fevers you cannot reduce with Tylenol/Motrin, severe headaches, vision changes, numbness/weakness in part of the body, lethargy, confusion, intractable vomiting, severe dehydration, chest pain, breathing difficulty, severe persistent abdominal or pelvic pain, signs of severe infection (increased redness, swelling of an area), feeling faint or passing out, dizziness, etc. You should especially go to the ED for sudden acute worsening of condition if you do not elect to go at this time.

## 2021-06-10 NOTE — ED Notes (Signed)
Patient is being discharged from the Urgent Care and sent to the Emergency Department via private vehicle . Per B.Michiel Cowboy PA, patient is in need of higher level of care due to Abdominal Pain. Patient is aware and verbalizes understanding of plan of care.  Vitals:   06/10/21 1917  BP: 116/81  Pulse: (!) 123  Resp: 18  Temp: 98.7 F (37.1 C)  SpO2: 98%

## 2021-06-10 NOTE — ED Triage Notes (Signed)
Pt presents today with c/o of pain to mid umbilicus from "hernia" x 4 days. She reports being seen here for same once before. Denies n/v/d.

## 2021-06-10 NOTE — ED Provider Notes (Signed)
[facility]    CSN: [id number] Arrival date & time: 06/10/21  1813      History   Chief Complaint Chief Complaint  Patient presents with   Abdominal Pain    Rt umbulicus    HPI Maneh Haire is a 50 y.o. female presenting for 3-day history of abdominal swelling over her umbilicus and pain.  Patient says the pain is getting worse and the swelling has enlarged.  It is associated with reduced appetite and nausea without vomiting.  Patient reports change in bowel movements.  States that she did have a BM today but it was only a small amount of stool.  No black or bloody stools.  No fevers.  She says she has felt feverish though.  Rates pain at 7 out of 10.  Pain increases when she lays down or moves or touches the area.  Skin is red over the area of swelling.  Patient believes she may have a hernia but has never been diagnosed with a hernia.  She does have a history of GERD.  Does not report any history of abdominal surgeries.  Has not taking thing for pain relief.  No other complaints.  HPI  Past Medical History:  Diagnosis Date   Anxiety    Borderline diabetes    Kidney stone    kidney stone    Patient Active Problem List   Diagnosis Date Noted   Anxiety 03/20/2016   Depression 03/20/2016   Diabetes mellitus type 2, uncomplicated (HCC) 03/20/2016   Hyperlipidemia, unspecified 03/20/2016    History reviewed. No pertinent surgical history.  OB History   No obstetric history on file.      Home Medications    Prior to Admission medications   Medication Sig Start Date End Date Taking? Authorizing Provider  amoxicillin-clavulanate (AUGMENTIN) 875-125 MG tablet Take 1 tablet by mouth 2 (two) times daily. X 7 days 01/25/20   Domenick Gong, MD  chlorpheniramine-HYDROcodone [facility] ER) 10-8 MG/5ML SUER Take 5 mLs by mouth every 12 (twelve) hours as needed for cough. 01/25/20   Domenick Gong, MD  diclofenac (VOLTAREN) 75 MG EC tablet Take 1  tablet (75 mg total) by mouth 2 (two) times daily. 05/24/20   Asencion Islam, DPM  famotidine (PEPCID) 20 MG tablet Take 1 tablet (20 mg total) by mouth 2 (two) times daily. 01/25/20   Domenick Gong, MD  fluticasone (FLONASE) 50 MCG/ACT nasal spray Place 2 sprays into both nostrils daily. 01/25/20   Domenick Gong, MD  predniSONE (STERAPRED UNI-PAK 21 TAB) 10 MG (21) TBPK tablet Take as directed 04/26/20   Asencion Islam, DPM  albuterol (PROVENTIL HFA;VENTOLIN HFA) 108 (90 Base) MCG/ACT inhaler Inhale 2 puffs into the lungs every 6 (six) hours as needed for wheezing or shortness of breath. 01/29/17 01/25/20  Sudie Grumbling, NP  clonazePAM (KLONOPIN) 0.5 MG tablet Take 1 tablet by mouth as needed.  01/25/20  [provider]    Family History Family History  Problem Relation Age of Onset   Diabetes Mother    Hypertension Mother    Diabetes Father    Hypertension Father    Diabetes Maternal Grandmother    Heart disease Maternal Grandmother    Diabetes Maternal Grandfather    Diabetes Paternal Grandmother    Diabetes Paternal Grandfather     Social History Social History   Tobacco Use   Smoking status: Never   Smokeless tobacco: Never  Vaping Use   Vaping Use: Never used  Substance  Use Topics   Alcohol use: Yes    Comment: occasionally   Drug use: No     Allergies   Patient has no known allergies.   Review of Systems Review of Systems  Constitutional:  Positive for appetite change. Negative for chills, diaphoresis, fatigue and fever.  Respiratory:  Negative for shortness of breath.   Cardiovascular:  Negative for chest pain.  Gastrointestinal:  Positive for abdominal pain, constipation and nausea. Negative for diarrhea and vomiting.  Musculoskeletal:  Positive for myalgias. Negative for arthralgias and back pain.  Skin:  Negative for rash.  Neurological:  Negative for weakness and headaches.    Physical Exam Triage Vital Signs ED Triage Vitals  Enc Vitals  Group     BP      Pulse      Resp      Temp      Temp src      SpO2      Weight      Height      Head Circumference      Peak Flow      Pain Score      Pain Loc      Pain Edu?      Excl. in GC?    No data found.  Updated Vital Signs BP 116/81 (BP Location: Left Arm)   Pulse (!) 123   Temp 98.7 F (37.1 C) (Oral)   Resp 18   LMP 05/27/2021 (Approximate)   SpO2 98%     Physical Exam Vitals and nursing note reviewed.  Constitutional:      General: She is not in acute distress.    Appearance: Normal appearance. She is not ill-appearing or toxic-appearing.     Comments: Appears very uncomfortable when she has to lay down.  HENT:     Head: Normocephalic and atraumatic.  Eyes:     General: No scleral icterus.       Right eye: No discharge.        Left eye: No discharge.     Conjunctiva/sclera: Conjunctivae normal.  Cardiovascular:     Rate and Rhythm: Regular rhythm. Tachycardia present.     Heart sounds: Normal heart sounds.  Pulmonary:     Effort: Pulmonary effort is normal. No respiratory distress.     Breath sounds: Normal breath sounds.  Abdominal:     General: Bowel sounds are decreased.     Palpations: Abdomen is soft.     Tenderness: There is abdominal tenderness (TTP superior to umbilicus. This area is indurated and very TTP. Skin over this area is erythematous). There is guarding.  Musculoskeletal:     Cervical back: Neck supple.  Skin:    General: Skin is dry.  Neurological:     General: No focal deficit present.     Mental Status: She is alert. Mental status is at baseline.     Motor: No weakness.     Gait: Gait normal.  Psychiatric:        Mood and Affect: Mood normal.        Behavior: Behavior normal.        Thought Content: Thought content normal.     UC Treatments / Results  Labs (all labs ordered are listed, but only abnormal results are displayed) Labs Reviewed - No data to display  EKG   Radiology No results  found.  Procedures Procedures (including critical care time)  Medications Ordered in UC Medications - No data to display  Initial  Impression / Assessment and Plan / UC Course  I have reviewed the triage vital signs and the nursing notes.  Pertinent labs & imaging results that were available during my care of the patient were reviewed by me and considered in my medical decision making (see chart for details).  50 year old female presenting for periumbilical abdominal pain and swelling for the past 3 days.  Has felt feverish and had body aches.  Admits to change in bowel movements with only a small BM today.  Has still been passing gas and belching.  Believes she may have a hernia that is enlarging.  On exam she does have a large area of induration and firmness superior to the umbilicus.  Skin overlying is erythematous and warm.  She is guarding this area heavily.  Slightly decreased bowel sounds.  Advised patient that she needs to be seen in the emergency department right away.  Suspect that she may have an incarcerated or strangulated hernia or small bowel obstruction.  Discussed that this is a medical emergency and it cannot wait.  She has already been having the symptoms for a few days.  Patient declines EMS transport.  She plans to drive herself to [facility] ED.  She is leaving in stable condition.  I did review with her that this cannot wait and does have the possibility of leading to serious bad outcomes including death if there is a rupture in the case of strangulated hernia or bowel obstruction.  Final Clinical Impressions(s) / UC Diagnoses   Final diagnoses:  Periumbilical abdominal pain     Discharge Instructions      I have concern for strangulated hernia and/or bowel obstruction.  This is a serious condition and it is already been going on for a couple of days.  As we discussed it is a medical emergency so advised to the emergency department this time.  Please proceed to [facility] ED.  If  you wait any longer, this could rupture and bleed to serious problems including death.  [facility] ED: [address], [place], [place] [address] Phone: [phone]  You have been advised to follow up immediately in the emergency department for concerning signs.symptoms. If you declined EMS transport, please have a family member take you directly to the ED at this time. Do not delay. Based on concerns about condition, if you do not follow up in th e ED, you may risk poor outcomes including worsening of condition, delayed treatment and potentially life threatening issues. If you have declined to go to the ED at this time, you should call your PCP immediately to set up a follow up appointment.  Go to ED for red flag symptoms, including; fevers you cannot reduce with Tylenol/Motrin, severe headaches, vision changes, numbness/weakness in part of the body, lethargy, confusion, intractable vomiting, severe dehydration, chest pain, breathing difficulty, severe persistent abdominal or pelvic pain, signs of severe infection (increased redness, swelling of an area), feeling faint or passing out, dizziness, etc. You should especially go to the ED for sudden acute worsening of condition if you do not elect to go at this time.      ED Prescriptions   None    PDMP not reviewed this encounter.   Shirlee Latch, PA-C 06/10/21 1939

## 2021-06-11 ENCOUNTER — Ambulatory Visit: Payer: BC Managed Care – PPO | Admitting: Family Medicine

## 2021-06-11 DIAGNOSIS — R1084 Generalized abdominal pain: Secondary | ICD-10-CM | POA: Diagnosis not present

## 2021-06-11 DIAGNOSIS — K436 Other and unspecified ventral hernia with obstruction, without gangrene: Secondary | ICD-10-CM | POA: Diagnosis not present

## 2021-06-11 HISTORY — PX: HERNIA REPAIR: SHX51

## 2021-07-02 DIAGNOSIS — Z9889 Other specified postprocedural states: Secondary | ICD-10-CM | POA: Diagnosis not present

## 2021-07-02 DIAGNOSIS — Z8719 Personal history of other diseases of the digestive system: Secondary | ICD-10-CM | POA: Diagnosis not present

## 2021-07-02 DIAGNOSIS — Z09 Encounter for follow-up examination after completed treatment for conditions other than malignant neoplasm: Secondary | ICD-10-CM | POA: Diagnosis not present

## 2021-08-15 ENCOUNTER — Ambulatory Visit: Payer: BC Managed Care – PPO | Admitting: Family Medicine

## 2021-08-15 ENCOUNTER — Other Ambulatory Visit: Payer: Self-pay

## 2021-08-15 ENCOUNTER — Encounter: Payer: Self-pay | Admitting: Family Medicine

## 2021-08-15 VITALS — BP 122/72 | HR 90 | Ht 62.0 in | Wt 218.8 lb

## 2021-08-15 DIAGNOSIS — D509 Iron deficiency anemia, unspecified: Secondary | ICD-10-CM | POA: Diagnosis not present

## 2021-08-15 DIAGNOSIS — Z7689 Persons encountering health services in other specified circumstances: Secondary | ICD-10-CM | POA: Diagnosis not present

## 2021-08-15 DIAGNOSIS — L680 Hirsutism: Secondary | ICD-10-CM

## 2021-08-15 DIAGNOSIS — R7303 Prediabetes: Secondary | ICD-10-CM | POA: Diagnosis not present

## 2021-08-15 DIAGNOSIS — E559 Vitamin D deficiency, unspecified: Secondary | ICD-10-CM | POA: Diagnosis not present

## 2021-08-15 DIAGNOSIS — D649 Anemia, unspecified: Secondary | ICD-10-CM | POA: Insufficient documentation

## 2021-08-15 DIAGNOSIS — N921 Excessive and frequent menstruation with irregular cycle: Secondary | ICD-10-CM

## 2021-08-15 DIAGNOSIS — E538 Deficiency of other specified B group vitamins: Secondary | ICD-10-CM | POA: Diagnosis not present

## 2021-08-15 NOTE — Patient Instructions (Addendum)
Thank you for coming to the office today.  Referral sent today for heavy menstrual bleeding and anemia, and routine pap smear  [facility]   Address: [address] [place], [place] [address], [place], [place] [address] Hours: 8AM-5PM Phone: [phone]  For the anemia, I am worried that the blood loss is contributing, this can cause fatigue.  If you cannot tolerate the Iron at this time, by pill we can consider a IV iron infusion from hematologist.  First blood panels to learn more.  Labs ordered, will check for PCOS as well. And vitamins, thyroid panel.  ---------------------------------------------------  [facility] Address: [address] [place], [place], [place] [address] Phone: [phone]  Referral sent via fax today. Stay tuned to hear back from them, they should call you to schedule initial sleep study, likely a home test and then if abnormal - they will arrange the 2nd part of the sleep study in the lab with the CPAP machine.  If there is any issue with this company, for example not covered by insurance or other problem, please notify us and they may do so a well - we will need to change the location of the referral.  -----  Call insurance find cost and coverage of the following  Pre-Diabetes Ozempic (injection once week Trulicity (injection once week  Weight Management Only Saxenda (daily inj) Wegovy (weekly inj)  We can get these newer meds at low cost if you are interested.  3 benefits - 1 significantly reduced A1c sugar, and may be able to reduce or stop metformin in future - 2 reduced appetite and weight loss with good results - 3 cardiovascular risk reduction, less likely to have heart attack/stroke   Please schedule a Follow-up Appointment to: Return in about 4 weeks (around 09/12/2021) for 1 month follow-up weight management, updates anemia, sleep/OSA, GYN.  If you have any other questions or concerns, please feel free to  call the office or send a message through MyChart. You may also schedule an earlier appointment if necessary.  Additionally, you may be receiving a survey about your experience at our office within a few days to 1 week by e-mail or mail. We value your feedback.  Saralyn Pilar, DO [facility], [place]

## 2021-08-15 NOTE — Progress Notes (Signed)
Subjective:    Patient ID: Beth [name], female    DOB: Jun 30, 1971, 50 y.o.   MRN: [id number]  Beth [name] is a 50 y.o. female presenting on 08/15/2021 for New Patient (Initial Visit), Diabetes (Found out she was a diabetic after surgery in October. ), and Obesity  Previous PCP Dr Elizabeth Sauer.  Works for [employer], works as a Microbiologist. Has 4 children, 2 in [place] and 2 in [place] here with her  HPI  Morbid Obesity BMI >40 Pre-Diabetes  Friend, co worker lost weight with injectable medications and has had good success. She asks about med options in future Prior A1c 5.8 in PreDM Range History of possible PCOS with some history of hirsutism She is interested in all testing labs vitamins, hormonal.  S/p Periumbilical Hernia Repair 05/2021  Left Knee Pain, persistent problem.  Chronic Fatigue Abnormal Sleep Suspected Sleep Apnea Admits daytime sleepiness  Epworth Sleepiness Scale Total Score: 16 Sitting and reading - 2 Watching TV - 3 Sitting inactive in a public place - 2 As a passenger in a car for an hour without a break - 3 Lying down to rest in the afternoon when circumstances permit - 3 Sitting and talking to someone - 1 Sitting quietly after a lunch without alcohol - 2 In a car, while stopped for a few minutes in traffic - 0  STOP-Bang OSA scoring Snoring yes   Tiredness yes   Observed apneas no   Pressure HTN no   BMI > 35 kg/m2 yes   Age > 50  no   Neck (female >17 in; Female >16 in)  no   Gender female no   OSA risk low (0-2)  OSA risk intermediate (3-4)  OSA risk high (5+)  Total: 3 Intermediate     Anemia, microcytic Last CBC panel was Hgb 11.7 in 10/24 around surgery then 2 days later 9.7 Admits heavy menstrual cycles, with blood clots Needs pap and GYN  Depression screen [facility] 2/9 08/15/2021 01/20/2019  Decreased Interest 2 1  Down, Depressed, Hopeless 1 1  PHQ - 2 Score 3 2  Altered sleeping 3 1  Tired, decreased energy 2 0   Change in appetite 1 2  Feeling bad or failure about yourself  0 0  Trouble concentrating 3 0  Moving slowly or fidgety/restless 0 0  Suicidal thoughts 0 0  PHQ-9 Score 12 5  Difficult doing work/chores Very difficult Somewhat difficult    Past Medical History:  Diagnosis Date   Anemia    Anxiety    Depression    GERD (gastroesophageal reflux disease)    Kidney stone    kidney stone   Past Surgical History:  Procedure Laterality Date   HERNIA REPAIR  06/11/2021   Social History   Socioeconomic History   Marital status: Divorced    Spouse name: Not on file   Number of children: 4   Years of education: Not on file   Highest education level: Not on file  Occupational History   Not on file  Tobacco Use   Smoking status: Never   Smokeless tobacco: Never  Vaping Use   Vaping Use: Never used  Substance and Sexual Activity   Alcohol use: Yes    Alcohol/week: 2.0 standard drinks    Types: 2 Standard drinks or equivalent per week    Comment: occasionally   Drug use: No   Sexual activity: Not Currently    Birth control/protection: None  Other Topics Concern  Not on file  Social History Narrative   Not on file   Social Determinants of Health   Financial Resource Strain: Not on file  Food Insecurity: Not on file  Transportation Needs: Not on file  Physical Activity: Not on file  Stress: Not on file  Social Connections: Not on file  Intimate Partner Violence: Not on file   Family History  Problem Relation Age of Onset   Diabetes Mother    Hypertension Mother    Diabetes Father    Hypertension Father    Diabetes Maternal Grandmother    Heart disease Maternal Grandmother    Diabetes Maternal Grandfather    Diabetes Paternal Grandmother    Diabetes Paternal Grandfather    Current Outpatient Medications on File Prior to Visit  Medication Sig   famotidine (PEPCID) 20 MG tablet Take 1 tablet (20 mg total) by mouth 2 (two) times daily.   [DISCONTINUED]  albuterol (PROVENTIL HFA;VENTOLIN HFA) 108 (90 Base) MCG/ACT inhaler Inhale 2 puffs into the lungs every 6 (six) hours as needed for wheezing or shortness of breath.   [DISCONTINUED] clonazePAM (KLONOPIN) 0.5 MG tablet Take 1 tablet by mouth as needed.   No current facility-administered medications on file prior to visit.    Review of Systems Per HPI unless specifically indicated above      Objective:    BP 122/72    Pulse 90    Ht 5\' 2"  (1.575 m)    Wt 218 lb 12.8 oz (99.2 kg)    LMP 08/03/2021 (Exact Date)    SpO2 97%    BMI 40.02 kg/m   Wt Readings from Last 3 Encounters:  08/15/21 218 lb 12.8 oz (99.2 kg)  01/25/20 225 lb (102.1 kg)  01/20/19 224 lb (101.6 kg)    Physical Exam Vitals and nursing note reviewed.  Constitutional:      General: She is not in acute distress.    Appearance: Normal appearance. She is well-developed. She is obese. She is not diaphoretic.     Comments: Well-appearing, comfortable, cooperative  HENT:     Head: Normocephalic and atraumatic.  Eyes:     General:        Right eye: No discharge.        Left eye: No discharge.     Conjunctiva/sclera: Conjunctivae normal.  Cardiovascular:     Rate and Rhythm: Normal rate.  Pulmonary:     Effort: Pulmonary effort is normal.  Skin:    General: Skin is warm and dry.     Findings: No erythema or rash.  Neurological:     Mental Status: She is alert and oriented to person, place, and time.  Psychiatric:        Mood and Affect: Mood normal.        Behavior: Behavior normal.        Thought Content: Thought content normal.     Comments: Well groomed, good eye contact, normal speech and thoughts     Results for orders placed or performed in visit on 01/20/19  Hemoglobin A1c  Result Value Ref Range   Hgb A1c MFr Bld 5.6 4.8 - 5.6 %   Est. average glucose Bld gHb Est-mCnc 114 mg/dL      Assessment & Plan:   Problem List Items Addressed This Visit     Pre-diabetes - Primary   Relevant Orders    Hemoglobin A1c   Morbid obesity (HCC)   Relevant Orders   Testosterone   Thyroid Panel With  TSH   Microcytic anemia   Relevant Orders   Ambulatory referral to Obstetrics / Gynecology   CBC with Differential/Platelet   Iron, TIBC and Ferritin Panel   Other Visit Diagnoses     Encounter to establish care with new doctor       Menorrhagia with irregular cycle       Relevant Orders   Ambulatory referral to Obstetrics / Gynecology   Vitamin D deficiency       Relevant Orders   VITAMIN D 25 Hydroxy (Vit-D Deficiency, Fractures)   Vitamin B12 nutritional deficiency       Relevant Orders   Vitamin B12   Hirsutism       Relevant Orders   Testosterone   Thyroid Panel With TSH       Morbid Obesity BMI >40  Lab panel today for diagnostic eval based on wt and risk of PreDm and other factors contributing  Discussion on GLP1 therapy, handout today reconsider at next visit for management of weight.  Fatigue can be multi factorial  Known microcytic anemia, no longer on oral iron intolerance side effect Likely GYN menorrhagia loss with irregularity May warrant hematology IV Iron in future if indicated.  Question PCOS, will check testosterone  Check Thyroid panel as well.  Referral to [facility] for abnormal menorrhagia w/ irregularity and further eval PCOS can review testosterone result.  New problem with Persistent clinical concern for suspected obstructive sleep apnea given reported symptoms with snoring and sleep disturbance, fatigue excessive sleepiness. - Screening: ESS score 16 / STOP-Bang Score 3 intermediate - Neck Circumference: < 16"  Plan: 1. Discussion on initial diagnosis and testing for OSA, risk factors, management, complications 2. Agree to proceed with sleep study testing based on clinical concerns - will fax referral to [facility] - to arrange initial PSG home vs in sleep lab and further evaluation.   Orders Placed This Encounter  Procedures    Hemoglobin A1c   CBC with Differential/Platelet   Iron, TIBC and Ferritin Panel   Vitamin B12   VITAMIN D 25 Hydroxy (Vit-D Deficiency, Fractures)   Testosterone   Thyroid Panel With TSH   Ambulatory referral to Obstetrics / Gynecology    Referral Priority:   Routine    Referral Type:   Consultation    Referral Reason:   Specialty Services Required    Requested Specialty:   Obstetrics and Gynecology    Number of Visits Requested:   1      No orders of the defined types were placed in this encounter.     Follow up plan: Return in about 4 weeks (around 09/12/2021) for 1 month follow-up weight management, updates anemia, sleep/OSA, GYN.  Saralyn Pilar, DO [facility] 08/15/2021, 10:47 AM

## 2021-08-16 ENCOUNTER — Telehealth: Payer: Self-pay

## 2021-08-16 NOTE — Telephone Encounter (Signed)
SGM referring for referral to GYN for menorrhagia with irregular cycle and has microcytic iron deficiency anemia, she has some fatigue. Also due for routine women's screen pap smear.  Also she has PCOS symptoms as well obesity hirsutism irregular menstrual, checking testosterone. Called and left voicemail for patient to call back to be scheduled.

## 2021-08-17 LAB — CBC WITH DIFFERENTIAL/PLATELET
Absolute Monocytes: 828 cells/uL (ref 200–950)
Basophils Absolute: 51 cells/uL (ref 0–200)
Basophils Relative: 0.5 %
Eosinophils Absolute: 111 cells/uL (ref 15–500)
Eosinophils Relative: 1.1 %
HCT: 38.1 % (ref 35.0–45.0)
Hemoglobin: 11.8 g/dL (ref 11.7–15.5)
Lymphs Abs: 3040 cells/uL (ref 850–3900)
MCH: 22.5 pg — ABNORMAL LOW (ref 27.0–33.0)
MCHC: 31 g/dL — ABNORMAL LOW (ref 32.0–36.0)
MCV: 72.7 fL — ABNORMAL LOW (ref 80.0–100.0)
MPV: 9.4 fL (ref 7.5–12.5)
Monocytes Relative: 8.2 %
Neutro Abs: 6070 cells/uL (ref 1500–7800)
Neutrophils Relative %: 60.1 %
Platelets: 360 10*3/uL (ref 140–400)
RBC: 5.24 10*6/uL — ABNORMAL HIGH (ref 3.80–5.10)
RDW: 15.6 % — ABNORMAL HIGH (ref 11.0–15.0)
Total Lymphocyte: 30.1 %
WBC: 10.1 10*3/uL (ref 3.8–10.8)

## 2021-08-17 LAB — HEMOGLOBIN A1C
Hgb A1c MFr Bld: 5.7 % of total Hgb — ABNORMAL HIGH (ref <5.7)
Mean Plasma Glucose: 117 mg/dL
eAG (mmol/L): 6.5 mmol/L

## 2021-08-17 LAB — THYROID PANEL WITH TSH
Free Thyroxine Index: 2.2 (ref 1.4–3.8)
T3 Uptake: 29 % (ref 22–35)
T4, Total: 7.5 ug/dL (ref 5.1–11.9)
TSH: 1.46 mIU/L

## 2021-08-17 LAB — IRON,TIBC AND FERRITIN PANEL
%SAT: 8 % (calc) — ABNORMAL LOW (ref 16–45)
Ferritin: 6 ng/mL — ABNORMAL LOW (ref 16–232)
Iron: 36 ug/dL — ABNORMAL LOW (ref 40–190)
TIBC: 468 mcg/dL (calc) — ABNORMAL HIGH (ref 250–450)

## 2021-08-17 LAB — VITAMIN D 25 HYDROXY (VIT D DEFICIENCY, FRACTURES): Vit D, 25-Hydroxy: 12 ng/mL — ABNORMAL LOW (ref 30–100)

## 2021-08-17 LAB — TESTOSTERONE, TOTAL, LC/MS/MS: Testosterone, Total, LC-MS-MS: 14 ng/dL (ref 2–45)

## 2021-08-17 LAB — VITAMIN B12: Vitamin B-12: 313 pg/mL (ref 200–1100)

## 2021-08-23 NOTE — Progress Notes (Signed)
Sleep Medicine   Office Visit  Patient Name: Beth [name] DOB: 11/02/1970 MRN [id number]    Chief Complaint: initial evaluation  Brief History:  Beth [name] presents with a two month history of the following symptoms: diabetes, obesity, chronic fatigue,  Epworth >10. Patient states she has never been on PAP therapy before.   Sleep quality is very poor.  This is no worse in any particular position. .  This is noted most nights. The patient's bed partner reports  snoring at night. The patient relates the following symptoms: snoring & waking up choking and this has been going on most of her adult life are also present. The patient goes to sleep at 10 am  and wakes up at 7am.  She reports that her sleep quality is very poor.  Sleep quality is the same when outside home environment.  Patient has noted cramps of her legs at night.   The patient reports a history of psychiatric problems. (Anxiety) The Epworth Sleepiness Score is 16 out of 24 .  The patient relates     ROS  General: (-) fever, (-) chills, (-) night sweat Nose and Sinuses: (-) nasal stuffiness or itchiness, (-) postnasal drip, (-) nosebleeds, (-) sinus trouble. Mouth and Throat: (-) sore throat, (-) hoarseness. Neck: (-) swollen glands, (-) enlarged thyroid, (-) neck pain. Respiratory: - cough, - shortness of breath, - wheezing. Neurologic: - numbness, - tingling. Psychiatric: + anxiety, + depression Sleep behavior: -sleep paralysis -hypnogogic hallucinations -dream enactment      -vivid dreams -cataplexy -night terrors -sleep walking   Current Medication: Outpatient Encounter Medications as of 08/26/2021  Medication Sig   famotidine (PEPCID) 20 MG tablet Take 1 tablet (20 mg total) by mouth 2 (two) times daily.   [DISCONTINUED] albuterol (PROVENTIL HFA;VENTOLIN HFA) 108 (90 Base) MCG/ACT inhaler Inhale 2 puffs into the lungs every 6 (six) hours as needed for wheezing or shortness of breath.   [DISCONTINUED] clonazePAM (KLONOPIN)  0.5 MG tablet Take 1 tablet by mouth as needed.   No facility-administered encounter medications on file as of 08/26/2021.    Surgical History: Past Surgical History:  Procedure Laterality Date   HERNIA REPAIR  06/11/2021    Medical History: Past Medical History:  Diagnosis Date   Anemia    Anxiety    Depression    GERD (gastroesophageal reflux disease)    Kidney stone    kidney stone    Family History: Non contributory to the present illness  Social History: Social History   Socioeconomic History   Marital status: Divorced    Spouse name: Not on file   Number of children: 4   Years of education: Not on file   Highest education level: Not on file  Occupational History   Not on file  Tobacco Use   Smoking status: Never   Smokeless tobacco: Never  Vaping Use   Vaping Use: Never used  Substance and Sexual Activity   Alcohol use: Yes    Alcohol/week: 2.0 standard drinks    Types: 2 Standard drinks or equivalent per week    Comment: occasionally   Drug use: No   Sexual activity: Not Currently    Birth control/protection: None  Other Topics Concern   Not on file  Social History Narrative   Not on file   Social Determinants of Health   Financial Resource Strain: Not on file  Food Insecurity: Not on file  Transportation Needs: Not on file  Physical Activity: Not on file  Stress:  Not on file  Social Connections: Not on file  Intimate Partner Violence: Not on file    Vital Signs: Blood pressure 130/83, pulse (!) 106, resp. rate 16, height 5\' 2"  (1.575 m), weight 218 lb (98.9 kg), last menstrual period 08/03/2021, SpO2 97 %. Body mass index is 39.87 kg/m.   Examination:38 cm General Appearance: The patient is well-developed, well-nourished, and in no distress. Neck Circumference:  Skin: Gross inspection of skin unremarkable. Head: normocephalic, no gross deformities. Eyes: no gross deformities noted. ENT: ears appear grossly normal Neurologic: Alert and  oriented. No involuntary movements.    EPWORTH SLEEPINESS SCALE:  Scale:  (0)= no chance of dozing; (1)= slight chance of dozing; (2)= moderate chance of dozing; (3)= high chance of dozing  Chance  Situtation    Sitting and reading: 2    Watching TV: 3    Sitting Inactive in public: 1    As a passenger in car: 3      Lying down to rest: 3    Sitting and talking: 1    Sitting quielty after lunch: 3    In a car, stopped in traffic: 0   TOTAL SCORE:   16 out of 24    SLEEP STUDIES:  No studies on file   LABS: Recent Results (from the past 2160 hour(s))  Hemoglobin A1c     Status: Abnormal   Collection Time: 08/15/21 11:47 AM  Result Value Ref Range   Hgb A1c MFr Bld 5.7 (H) <5.7 % of total Hgb    Comment: For someone without known diabetes, a hemoglobin  A1c value between 5.7% and 6.4% is consistent with prediabetes and should be confirmed with a  follow-up test. . For someone with known diabetes, a value <7% indicates that their diabetes is well controlled. A1c targets should be individualized based on duration of diabetes, age, comorbid conditions, and other considerations. . This assay result is consistent with an increased risk of diabetes. . Currently, no consensus exists regarding use of hemoglobin A1c for diagnosis of diabetes for children. .    Mean Plasma Glucose 117 mg/dL   eAG (mmol/L) 6.5 mmol/L  CBC with Differential/Platelet     Status: Abnormal   Collection Time: 08/15/21 11:47 AM  Result Value Ref Range   WBC 10.1 3.8 - 10.8 Thousand/uL   RBC 5.24 (H) 3.80 - 5.10 Million/uL   Hemoglobin 11.8 11.7 - 15.5 g/dL   HCT 06.238.1 69.435.0 - 85.445.0 %   MCV 72.7 (L) 80.0 - 100.0 fL   MCH 22.5 (L) 27.0 - 33.0 pg   MCHC 31.0 (L) 32.0 - 36.0 g/dL   RDW 62.715.6 (H) 03.511.0 - 00.915.0 %   Platelets 360 140 - 400 Thousand/uL   MPV 9.4 7.5 - 12.5 fL   Neutro Abs 6,070 1,500 - 7,800 cells/uL   Lymphs Abs 3,040 850 - 3,900 cells/uL   Absolute Monocytes 828 200 -  950 cells/uL   Eosinophils Absolute 111 15 - 500 cells/uL   Basophils Absolute 51 0 - 200 cells/uL   Neutrophils Relative % 60.1 %   Total Lymphocyte 30.1 %   Monocytes Relative 8.2 %   Eosinophils Relative 1.1 %   Basophils Relative 0.5 %  Iron, TIBC and Ferritin Panel     Status: Abnormal   Collection Time: 08/15/21 11:47 AM  Result Value Ref Range   Iron 36 (L) 40 - 190 mcg/dL   TIBC 381468 (H) 829250 - 937450 mcg/dL (calc)   %SAT 8 (L)  16 - 45 % (calc)   Ferritin 6 (L) 16 - 232 ng/mL  Vitamin B12     Status: None   Collection Time: 08/15/21 11:47 AM  Result Value Ref Range   Vitamin B-12 313 200 - 1,100 pg/mL    Comment: . Please Note: Although the reference range for vitamin B12 is [phone] pg/mL, it has been reported that between 5 and 10% of patients with values between 200 and 400 pg/mL may experience neuropsychiatric and hematologic abnormalities due to occult B12 deficiency; less than 1% of patients with values above 400 pg/mL will have symptoms. Marland Kitchen   VITAMIN D 25 Hydroxy (Vit-D Deficiency, Fractures)     Status: Abnormal   Collection Time: 08/15/21 11:47 AM  Result Value Ref Range   Vit D, 25-Hydroxy 12 (L) 30 - 100 ng/mL    Comment: Vitamin D Status         25-OH Vitamin D: . Deficiency:                    <20 ng/mL Insufficiency:             20 - 29 ng/mL Optimal:                 > or = 30 ng/mL . For 25-OH Vitamin D testing on patients on  D2-supplementation and patients for whom quantitation  of D2 and D3 fractions is required, the Quest[employer]ureD(TM) 25-OH VIT D, (D2,D3), LC/MS/MS is recommended: order  code 26203 (patients >10yrs). See Note 1 . Note 1 . For additional information, please refer to  http://education.[facility].com/faq/FAQ199  (This link is being provided for informational/ educational purposes only.)   Thyroid Panel With TSH     Status: None   Collection Time: 08/15/21 11:47 AM  Result Value Ref Range   T3 Uptake 29 22 - 35 %   T4, Total  7.5 5.1 - 11.9 mcg/dL   Free Thyroxine Index 2.2 1.4 - 3.8   TSH 1.46 mIU/L    Comment:           Reference Range .           > or = 20 Years  0.40-4.50 .                Pregnancy Ranges           First trimester    0.26-2.66           Second trimester   0.55-2.73           Third trimester    0.43-2.91   Testosterone, Total, LC/MS/MS     Status: None   Collection Time: 08/15/21 11:47 AM  Result Value Ref Range   Testosterone, Total, LC-MS-MS 14 2 - 45 ng/dL    Comment: . For additional information, please refer to https://education.quest[facility].com/faq/TotalTestosteroneLCMSMS  (This link is being provided for informational/educational purposes only.) (Note) . This test was developed and its analytical performance characteristics have been determined by [facility]. It has not been cleared or approved by the FDA. This assay has been validated pursuant to the CLIA regulations and is used for clinical purposes. . . [facility] [address] [place] [address] [phone] Eulah Pont, MD     Radiology: No results found.  No results found.  No results found.    Assessment and Plan: Patient Active Problem List   Diagnosis Date Noted   OSA (obstructive sleep apnea) 08/26/2021   Obesity (BMI  30-39.9) 08/26/2021   Pre-diabetes 08/15/2021   Microcytic anemia 08/15/2021   Morbid obesity (HCC) 08/15/2021   Anxiety 03/20/2016   Depression 03/20/2016   Hyperlipidemia, unspecified 03/20/2016   1. OSA (obstructive sleep apnea) PLAN OSA:   Patient evaluation suggests high risk of sleep disordered breathing due to snoring, gasping for breath, frequent awakenings, morning headaches, daytime fatigue.  Suggest: PSG  to assess the patient's sleep disordered breathing. The patient was also counselled on weight loss to optimize sleep health.   2. Obesity (BMI 30-39.9) Obesity Counseling: Had a lengthy discussion regarding patients BMI and  weight issues. Patient was instructed on portion control as well as increased activity. Also discussed caloric restrictions with trying to maintain intake less than 2000 Kcal. Discussions were made in accordance with the 5As of weight management. Simple actions such as not eating late and if able to, taking a walk is suggested.    General Counseling: I have discussed the findings of the evaluation and examination with [place].  I have also discussed any further diagnostic evaluation thatmay be needed or ordered today. Chanetta verbalizes understanding of the findings of todays visit. We also reviewed her medications today and discussed drug interactions and side effects including but not limited excessive drowsiness and altered mental states. We also discussed that there is always a risk not just to her but also people around her. she has been encouraged to call the office with any questions or concerns that should arise related to todays visit.  No orders of the defined types were placed in this encounter.       I have personally obtained a history, evaluated the patient, evaluated pertinent data, formulated the assessment and plan and placed orders. This patient was seen today by Emmaline Kluver, PA-C in collaboration with Dr. Freda Munro.     Yevonne Pax, MD [facility] Diplomate ABMS Pulmonary and Critical Care Medicine Sleep medicine

## 2021-08-26 ENCOUNTER — Ambulatory Visit (INDEPENDENT_AMBULATORY_CARE_PROVIDER_SITE_OTHER): Payer: BC Managed Care – PPO | Admitting: Internal Medicine

## 2021-08-26 VITALS — BP 130/83 | HR 106 | Resp 16 | Ht 62.0 in | Wt 218.0 lb

## 2021-08-26 DIAGNOSIS — E669 Obesity, unspecified: Secondary | ICD-10-CM | POA: Diagnosis not present

## 2021-08-26 DIAGNOSIS — G4733 Obstructive sleep apnea (adult) (pediatric): Secondary | ICD-10-CM | POA: Insufficient documentation

## 2021-09-03 NOTE — Progress Notes (Signed)
Smitty CordsKaramalegos, Alexander J, DO   Chief Complaint  Patient presents with   Menorrhagia    Heavier than usual, clots, pain before cycle starts    HPI:      Ms. Beth [name] is a 51 y.o. No obstetric history on file. whose LMP was Patient's last menstrual period was 07/28/2021 (approximate)., presents today for NP eval for menorrhagia for past 6-8 months,  IDA, referred by PCP. Menses are Q1-2 months (normal for pt), lasting 4-5 days, changing products Q2 hrs, with quarter sized clots, 1 episode light BTB with wiping for 3 days last month, usually no BTB, worsening dysmen before menses. Used to be lighter flow, changing products QID, no clots, no dysmen, no BTB.  Also gets headaches, lightheadedness and diarrhea before menses. Did OCPs in past without side effects. No hx of HTN, DVTs, migraines with aura, no tob use.  Has low iron/ferritin levels, Vit D deficiency, early pre-DM with 12/22 labs with PCP. (NOTES/LABS REVIEWED) Had normal testosterone levels. No FH ovar/uterine cancer. Not taking Fe supp.  Due for pap; no hx of abn paps. Not sex active.  Hasn't had mammo   Patient Active Problem List   Diagnosis Date Noted   OSA (obstructive sleep apnea) 08/26/2021   Obesity (BMI 30-39.9) 08/26/2021   Pre-diabetes 08/15/2021   Microcytic anemia 08/15/2021   Morbid obesity (HCC) 08/15/2021   Anxiety 03/20/2016   Depression 03/20/2016   Hyperlipidemia, unspecified 03/20/2016    Past Surgical History:  Procedure Laterality Date   HERNIA REPAIR  06/11/2021    Family History  Problem Relation Age of Onset   Diabetes Mother    Hypertension Mother    Diabetes Father    Hypertension Father    Diabetes Maternal Grandmother    Heart disease Maternal Grandmother    Diabetes Maternal Grandfather    Diabetes Paternal Grandmother    Diabetes Paternal Grandfather     Social History   Socioeconomic History   Marital status: Divorced    Spouse name: Not on file   Number of children: 4    Years of education: Not on file   Highest education level: Not on file  Occupational History   Not on file  Tobacco Use   Smoking status: Never   Smokeless tobacco: Never  Vaping Use   Vaping Use: Never used  Substance and Sexual Activity   Alcohol use: Yes    Alcohol/week: 2.0 standard drinks    Types: 2 Standard drinks or equivalent per week    Comment: occasionally   Drug use: No   Sexual activity: Not Currently    Birth control/protection: None  Other Topics Concern   Not on file  Social History Narrative   Not on file   Social Determinants of Health   Financial Resource Strain: Not on file  Food Insecurity: Not on file  Transportation Needs: Not on file  Physical Activity: Not on file  Stress: Not on file  Social Connections: Not on file  Intimate Partner Violence: Not on file    Outpatient Medications Prior to Visit  Medication Sig Dispense Refill   famotidine (PEPCID) 20 MG tablet Take 1 tablet (20 mg total) by mouth 2 (two) times daily. 60 tablet 0   No facility-administered medications prior to visit.      ROS:  Review of Systems  Constitutional:  Negative for fever.  Gastrointestinal:  Positive for constipation and diarrhea. Negative for blood in stool, nausea and vomiting.  Genitourinary:  Positive for  menstrual problem. Negative for dyspareunia, dysuria, flank pain, frequency, hematuria, urgency, vaginal bleeding, vaginal discharge and vaginal pain.  Musculoskeletal:  Negative for back pain.  Skin:  Negative for rash.  BREAST: No symptoms   OBJECTIVE:   Vitals:  BP 110/64    Ht 5\' 2"  (1.575 m)    Wt 220 lb (99.8 kg)    LMP 07/28/2021 (Approximate)    BMI 40.24 kg/m   Physical Exam Vitals reviewed.  Constitutional:      Appearance: She is well-developed.  Pulmonary:     Effort: Pulmonary effort is normal.  Genitourinary:    General: Normal vulva.     Pubic Area: No rash.      Labia:        Right: No rash, tenderness or lesion.         Left: No rash, tenderness or lesion.      Vagina: Normal. No vaginal discharge, erythema or tenderness.     Cervix: Normal.     Uterus: Normal. Not enlarged and not tender.      Adnexa: Right adnexa normal and left adnexa normal.       Right: No mass or tenderness.         Left: No mass or tenderness.    Musculoskeletal:        General: Normal range of motion.     Cervical back: Normal range of motion.  Skin:    General: Skin is warm and dry.  Neurological:     General: No focal deficit present.     Mental Status: She is alert and oriented to person, place, and time.  Psychiatric:        Mood and Affect: Mood normal.        Behavior: Behavior normal.        Thought Content: Thought content normal.        Judgment: Judgment normal.    Assessment/Plan: Menorrhagia with irregular cycle - Plan: US PELVIS TRANSVAGINAL NON-OB (TV ONLY); worsening sx for 6-8 months; Check GYN u/s, will f/u with results. Discussed tx options based on u/s, including OCPs, depo, IUD, ablation, lysteda, [place], hyst. Pt may do well with OCPs for other period sx as well. Will f/u with results and mgmt options. Add MVI with Fe.   Cervical cancer screening - Plan: Cytology - PAP  Screening for HPV (human papillomavirus) - Plan: Cytology - PAP  Encounter for screening mammogram for malignant neoplasm of breast - Plan: MM 3D SCREEN BREAST BILATERAL; pt to [facility]    Return if symptoms worsen or fail to improve.  Demba Nigh B. Artemisa Sladek, PA-C 09/04/2021 10:19 AM

## 2021-09-04 ENCOUNTER — Other Ambulatory Visit (HOSPITAL_COMMUNITY)
Admission: RE | Admit: 2021-09-04 | Discharge: 2021-09-04 | Disposition: A | Discharge status: Home / Self Care | Payer: BC Managed Care – PPO | Source: Ambulatory Visit | Attending: Obstetrics and Gynecology | Admitting: Obstetrics and Gynecology

## 2021-09-04 ENCOUNTER — Encounter: Payer: Self-pay | Admitting: Obstetrics and Gynecology

## 2021-09-04 ENCOUNTER — Other Ambulatory Visit: Payer: Self-pay

## 2021-09-04 ENCOUNTER — Ambulatory Visit (INDEPENDENT_AMBULATORY_CARE_PROVIDER_SITE_OTHER): Payer: BC Managed Care – PPO | Admitting: Obstetrics and Gynecology

## 2021-09-04 VITALS — BP 110/64 | Ht 62.0 in | Wt 220.0 lb

## 2021-09-04 DIAGNOSIS — Z1231 Encounter for screening mammogram for malignant neoplasm of breast: Secondary | ICD-10-CM | POA: Diagnosis not present

## 2021-09-04 DIAGNOSIS — N920 Excessive and frequent menstruation with regular cycle: Secondary | ICD-10-CM

## 2021-09-04 DIAGNOSIS — Z124 Encounter for screening for malignant neoplasm of cervix: Secondary | ICD-10-CM

## 2021-09-04 DIAGNOSIS — Z1151 Encounter for screening for human papillomavirus (HPV): Secondary | ICD-10-CM

## 2021-09-04 DIAGNOSIS — N921 Excessive and frequent menstruation with irregular cycle: Secondary | ICD-10-CM | POA: Diagnosis not present

## 2021-09-04 NOTE — Patient Instructions (Signed)
I value your feedback and you entrusting us with your care. If you get a [facility] patient survey, I would appreciate you taking the time to let us know about your experience today. Thank you! ? ? ?

## 2021-09-05 LAB — CYTOLOGY - PAP
Comment: NEGATIVE
Diagnosis: NEGATIVE
High risk HPV: NEGATIVE

## 2021-09-16 ENCOUNTER — Encounter: Payer: Self-pay | Admitting: Family Medicine

## 2021-09-16 ENCOUNTER — Ambulatory Visit (INDEPENDENT_AMBULATORY_CARE_PROVIDER_SITE_OTHER): Payer: BC Managed Care – PPO | Admitting: Family Medicine

## 2021-09-16 ENCOUNTER — Other Ambulatory Visit: Payer: Self-pay

## 2021-09-16 VITALS — BP 110/67 | HR 85 | Temp 97.5°F | Resp 17 | Ht 62.0 in | Wt 220.8 lb

## 2021-09-16 DIAGNOSIS — R7303 Prediabetes: Secondary | ICD-10-CM

## 2021-09-16 MED ORDER — OZEMPIC (0.25 OR 0.5 MG/DOSE) 2 MG/1.5ML ~~LOC~~ SOPN: 0.2500 mg | PEN_INJECTOR | SUBCUTANEOUS | 0 refills | Status: DC

## 2021-09-16 NOTE — Patient Instructions (Addendum)
Thank you for coming to the office today.  Ozempic (Semaglutide injection) - start 0.25mg  weekly for 4 weeks then increase to 0.5mg  weekly. After 6 weeks your sample will be empty, need to pick up new rx from pharmacy 90 day or 3 month.  Please message me on mychart after 3-4 weeks of sample when ready for me to order.    Please schedule a Follow-up Appointment to: Return in about 4 months (around 01/14/2022) for 4 month follow-up PreDM A1c Weight med.  If you have any other questions or concerns, please feel free to call the office or send a message through MyChart. You may also schedule an earlier appointment if necessary.  Additionally, you may be receiving a survey about your experience at our office within a few days to 1 week by e-mail or mail. We value your feedback.  Saralyn Pilar, DO [facility], [place]

## 2021-09-16 NOTE — Progress Notes (Signed)
Subjective:    Patient ID: Beth [name], female    DOB: 1970-12-25, 51 y.o.   MRN: [id number]  Beth [name] is a 51 y.o. female presenting on 09/16/2021 for weight managment and Sleep Apnea (Pt scheduled for Sleep study on Wednesday with [facility]. )   HPI  Morbid Obesity BMI >40 Pre-Diabetes Last A1c 5.7 (07/2021) Friend, co worker lost weight with injectable medications and has had good success. She would consider metformin or injectable, interested in Ozempic.  Left Knee Pain, persistent problem   Possible OSA Chronic Fatigue Suspected Sleep Apnea Admits daytime sleepiness Has apt already scheduled for initial PSG   Depression screen [facility] 2/9 09/16/2021 08/15/2021 01/20/2019  Decreased Interest 1 2 1   Down, Depressed, Hopeless 1 1 1   PHQ - 2 Score 2 3 2   Altered sleeping 3 3 1   Tired, decreased energy 2 2 0  Change in appetite 1 1 2   Feeling bad or failure about yourself  1 0 0  Trouble concentrating 2 3 0  Moving slowly or fidgety/restless 0 0 0  Suicidal thoughts 0 0 0  PHQ-9 Score 11 12 5   Difficult doing work/chores Somewhat difficult Very difficult Somewhat difficult    Social History   Tobacco Use   Smoking status: Never   Smokeless tobacco: Never  Vaping Use   Vaping Use: Never used  Substance Use Topics   Alcohol use: Yes    Alcohol/week: 2.0 standard drinks    Types: 2 Standard drinks or equivalent per week    Comment: occasionally   Drug use: No    Review of Systems Per HPI unless specifically indicated above     Objective:    BP 110/67 (BP Location: Right Arm, Patient Position: Sitting, Cuff Size: Large)    Pulse 85    Temp (!) 97.5 F (36.4 C) (Temporal)    Resp 17    Ht 5\' 2"  (1.575 m)    Wt 220 lb 12.8 oz (100.2 kg)    LMP 09/09/2021    SpO2 100%    BMI 40.38 kg/m   Wt Readings from Last 3 Encounters:  09/16/21 220 lb 12.8 oz (100.2 kg)  09/04/21 220 lb (99.8 kg)  08/26/21 218 lb (98.9 kg)    Physical Exam Vitals and nursing note  reviewed.  Constitutional:      General: She is not in acute distress.    Appearance: Normal appearance. She is well-developed. She is obese. She is not diaphoretic.     Comments: Well-appearing, comfortable, cooperative  HENT:     Head: Normocephalic and atraumatic.  Eyes:     General:        Right eye: No discharge.        Left eye: No discharge.     Conjunctiva/sclera: Conjunctivae normal.  Cardiovascular:     Rate and Rhythm: Normal rate.  Pulmonary:     Effort: Pulmonary effort is normal.  Skin:    General: Skin is warm and dry.     Findings: No erythema or rash.  Neurological:     Mental Status: She is alert and oriented to person, place, and time.  Psychiatric:        Mood and Affect: Mood normal.        Behavior: Behavior normal.        Thought Content: Thought content normal.     Comments: Well groomed, good eye contact, normal speech and thoughts   Results for orders placed or performed in visit  on 09/04/21  Cytology - PAP  Result Value Ref Range   High risk HPV Negative    Adequacy      Satisfactory for evaluation; transformation zone component PRESENT.   Diagnosis      - Negative for intraepithelial lesion or malignancy (NILM)   Comment Normal Reference Range HPV - Negative     Recent Labs    08/15/21 1147  HGBA1C 5.7*       Assessment & Plan:   Problem List Items Addressed This Visit     Pre-diabetes - Primary   Relevant Medications   OZEMPIC, 0.25 OR 0.5 MG/DOSE, 2 MG/1.5ML SOPN   Morbid obesity (HCC)   Relevant Medications   OZEMPIC, 0.25 OR 0.5 MG/DOSE, 2 MG/1.5ML SOPN    Weight management / Pre Dm management START GLP1 therapy in conjunction with lifestyle diet management  Ozempic (Semaglutide injection) - start 0.25mg  weekly for 4 weeks then increase to 0.5mg  weekly. After 6 weeks your sample will be empty, need to pick up new rx from pharmacy 90 day or 3 month.  Please message me on mychart after 3-4 weeks of sample when ready for me to  order.    Meds ordered this encounter  Medications   OZEMPIC, 0.25 OR 0.5 MG/DOSE, 2 MG/1.5ML SOPN    Sig: Inject 0.25 mg into the skin once a week. For first 4 weeks. Then increase dose to 0.5mg  weekly    Dispense:  1.5 mL    Refill:  0    Follow up plan: Return in about 4 months (around 01/14/2022) for 4 month follow-up PreDM A1c Weight med.   Saralyn Pilar, DO [facility] 09/16/2021, 9:16 AM

## 2021-09-18 DIAGNOSIS — G4730 Sleep apnea, unspecified: Secondary | ICD-10-CM | POA: Diagnosis not present

## 2021-09-19 ENCOUNTER — Ambulatory Visit (INDEPENDENT_AMBULATORY_CARE_PROVIDER_SITE_OTHER): Payer: BC Managed Care – PPO

## 2021-09-19 ENCOUNTER — Other Ambulatory Visit: Payer: Self-pay | Admitting: Obstetrics and Gynecology

## 2021-09-19 ENCOUNTER — Other Ambulatory Visit: Payer: Self-pay

## 2021-09-19 DIAGNOSIS — N921 Excessive and frequent menstruation with irregular cycle: Secondary | ICD-10-CM

## 2021-09-19 DIAGNOSIS — Z30011 Encounter for initial prescription of contraceptive pills: Secondary | ICD-10-CM

## 2021-09-23 ENCOUNTER — Telehealth: Payer: Self-pay | Admitting: Obstetrics and Gynecology

## 2021-09-26 MED ORDER — MICROGESTIN 24 FE 1-20 MG-MCG PO TABS: 1.0000 | ORAL_TABLET | Freq: Every day | ORAL | 0 refills | Status: DC

## 2021-09-26 NOTE — Telephone Encounter (Signed)
Pt aware of GYN u/s results. See notes on u/s report.

## 2021-09-26 NOTE — Progress Notes (Unsigned)
OCP Rx for menorrhagia.

## 2021-09-27 ENCOUNTER — Other Ambulatory Visit: Payer: Self-pay

## 2021-09-27 ENCOUNTER — Ambulatory Visit
Admission: RE | Admit: 2021-09-27 | Discharge: 2021-09-27 | Disposition: A | Discharge status: Home / Self Care | Payer: BC Managed Care – PPO | Source: Ambulatory Visit | Attending: Obstetrics and Gynecology | Admitting: Obstetrics and Gynecology

## 2021-09-27 DIAGNOSIS — Z1231 Encounter for screening mammogram for malignant neoplasm of breast: Secondary | ICD-10-CM | POA: Insufficient documentation

## 2021-09-30 ENCOUNTER — Encounter: Payer: Self-pay | Admitting: Family Medicine

## 2021-09-30 ENCOUNTER — Other Ambulatory Visit: Payer: Self-pay | Admitting: Obstetrics and Gynecology

## 2021-09-30 DIAGNOSIS — R928 Other abnormal and inconclusive findings on diagnostic imaging of breast: Secondary | ICD-10-CM

## 2021-09-30 DIAGNOSIS — F5101 Primary insomnia: Secondary | ICD-10-CM

## 2021-09-30 DIAGNOSIS — N63 Unspecified lump in unspecified breast: Secondary | ICD-10-CM

## 2021-10-01 MED ORDER — ZOLPIDEM TARTRATE 5 MG PO TABS: 5.0000 mg | ORAL_TABLET | Freq: Every evening | ORAL | 0 refills | Status: DC | PRN

## 2021-10-05 DIAGNOSIS — G4733 Obstructive sleep apnea (adult) (pediatric): Secondary | ICD-10-CM | POA: Diagnosis not present

## 2021-10-09 ENCOUNTER — Ambulatory Visit
Admission: RE | Admit: 2021-10-09 | Discharge: 2021-10-09 | Disposition: A | Discharge status: Home / Self Care | Payer: BC Managed Care – PPO | Source: Ambulatory Visit | Attending: Obstetrics and Gynecology | Admitting: Obstetrics and Gynecology

## 2021-10-09 ENCOUNTER — Encounter: Payer: Self-pay | Admitting: Obstetrics and Gynecology

## 2021-10-09 ENCOUNTER — Other Ambulatory Visit: Payer: Self-pay

## 2021-10-09 DIAGNOSIS — N63 Unspecified lump in unspecified breast: Secondary | ICD-10-CM | POA: Insufficient documentation

## 2021-10-09 DIAGNOSIS — R928 Other abnormal and inconclusive findings on diagnostic imaging of breast: Secondary | ICD-10-CM

## 2021-10-09 DIAGNOSIS — R922 Inconclusive mammogram: Secondary | ICD-10-CM | POA: Diagnosis not present

## 2021-10-22 ENCOUNTER — Encounter: Payer: Self-pay | Admitting: Family Medicine

## 2021-10-22 DIAGNOSIS — R7303 Prediabetes: Secondary | ICD-10-CM

## 2021-10-22 DIAGNOSIS — G4733 Obstructive sleep apnea (adult) (pediatric): Secondary | ICD-10-CM | POA: Diagnosis not present

## 2021-10-23 MED ORDER — OZEMPIC (0.25 OR 0.5 MG/DOSE) 2 MG/1.5ML ~~LOC~~ SOPN: 0.2500 mg | PEN_INJECTOR | SUBCUTANEOUS | 1 refills | Status: DC

## 2021-10-29 MED ORDER — WEGOVY 0.25 MG/0.5ML ~~LOC~~ SOAJ: 0.2500 mg | SUBCUTANEOUS | 0 refills | Status: DC

## 2021-10-29 NOTE — Addendum Note (Signed)
Addended by: Smitty Cords on: 10/29/2021 06:28 PM ? ? Modules accepted: Orders ? ?

## 2021-11-28 ENCOUNTER — Other Ambulatory Visit: Payer: Self-pay | Admitting: Family Medicine

## 2021-11-28 DIAGNOSIS — G4733 Obstructive sleep apnea (adult) (pediatric): Secondary | ICD-10-CM | POA: Diagnosis not present

## 2021-11-28 DIAGNOSIS — F5101 Primary insomnia: Secondary | ICD-10-CM

## 2021-11-28 MED ORDER — WEGOVY 0.5 MG/0.5ML ~~LOC~~ SOAJ: 0.5000 mg | SUBCUTANEOUS | 2 refills | Status: DC

## 2021-11-28 MED ORDER — ZOLPIDEM TARTRATE 5 MG PO TABS: 5.0000 mg | ORAL_TABLET | Freq: Every evening | ORAL | 0 refills | Status: DC | PRN

## 2021-12-18 ENCOUNTER — Other Ambulatory Visit: Payer: Self-pay | Admitting: Obstetrics and Gynecology

## 2021-12-18 DIAGNOSIS — Z30011 Encounter for initial prescription of contraceptive pills: Secondary | ICD-10-CM

## 2021-12-18 DIAGNOSIS — N921 Excessive and frequent menstruation with irregular cycle: Secondary | ICD-10-CM

## 2021-12-28 DIAGNOSIS — G4733 Obstructive sleep apnea (adult) (pediatric): Secondary | ICD-10-CM | POA: Diagnosis not present

## 2022-01-06 ENCOUNTER — Ambulatory Visit (INDEPENDENT_AMBULATORY_CARE_PROVIDER_SITE_OTHER): Payer: BC Managed Care – PPO | Admitting: Internal Medicine

## 2022-01-06 VITALS — BP 117/85 | HR 88 | Resp 16 | Ht 62.0 in | Wt 214.0 lb

## 2022-01-06 DIAGNOSIS — Z9989 Dependence on other enabling machines and devices: Secondary | ICD-10-CM | POA: Diagnosis not present

## 2022-01-06 DIAGNOSIS — E669 Obesity, unspecified: Secondary | ICD-10-CM | POA: Diagnosis not present

## 2022-01-06 DIAGNOSIS — Z7189 Other specified counseling: Secondary | ICD-10-CM | POA: Insufficient documentation

## 2022-01-06 DIAGNOSIS — G4733 Obstructive sleep apnea (adult) (pediatric): Secondary | ICD-10-CM

## 2022-01-06 NOTE — Patient Instructions (Signed)

## 2022-01-06 NOTE — Progress Notes (Signed)
[facility] [address] [place], [place] [address]  Pulmonary Sleep Medicine   Office Visit Note  Patient Name: Beth SaxDaydee [name] DOB: 09/15/1970 MRN [id number]    Chief Complaint: Obstructive Sleep Apnea visit  Brief History:  Beth [name] is seen today for follow up after setup. The patient has a 5 month.   history of sleep apnea. Patient is using PAP nightly with small F30 full mask..  The patient feels better after sleeping with PAP.  The patient reports benefiting from PAP use.  Epworth Sleepiness Score is 10 out of 24. The patient usually does not take naps. The patient complains of the following: c/o  after the first week,waking up panicking  because mask is covering her mouth and stuffy nose. The compliance download shows  compliance with an average use time of 4:49 hours@ 73%. The AHI is 0.1  The patient does occasionally complain of limb movements disrupting sleep.  ROS  General: (-) fever, (-) chills, (-) night sweat Nose and Sinuses: (-) nasal stuffiness or itchiness, (-) postnasal drip, (-) nosebleeds, (-) sinus trouble. Mouth and Throat: (-) sore throat, (-) hoarseness. Neck: (-) swollen glands, (-) enlarged thyroid, (-) neck pain. Respiratory: - cough, - shortness of breath, - wheezing. Neurologic: - numbness, - tingling. Psychiatric: - anxiety, - depression   Current Medication: Outpatient Encounter Medications as of 01/06/2022  Medication Sig   famotidine (PEPCID) 20 MG tablet Take 1 tablet (20 mg total) by mouth 2 (two) times daily.   Norethindrone Acetate-Ethinyl Estrad-FE (MICROGESTIN 24 FE) 1-20 MG-MCG(24) tablet Take 1 tablet by mouth daily.   WEGOVY 0.5 MG/0.5ML SOAJ Inject 0.5 mg into the skin once a week.   zolpidem (AMBIEN) 5 MG tablet Take 1-2 tablets (5-10 mg total) by mouth at bedtime as needed for sleep.   [DISCONTINUED] albuterol (PROVENTIL HFA;VENTOLIN HFA) 108 (90 Base) MCG/ACT inhaler Inhale 2 puffs into the lungs every 6 (six) hours as needed  for wheezing or shortness of breath.   [DISCONTINUED] clonazePAM (KLONOPIN) 0.5 MG tablet Take 1 tablet by mouth as needed.   No facility-administered encounter medications on file as of 01/06/2022.    Surgical History: Past Surgical History:  Procedure Laterality Date   HERNIA REPAIR  06/11/2021    Medical History: Past Medical History:  Diagnosis Date   Anemia    Anxiety    Depression    GERD (gastroesophageal reflux disease)    Kidney stone    kidney stone    Family History: Non contributory to the present illness  Social History: Social History   Socioeconomic History   Marital status: Divorced    Spouse name: Not on file   Number of children: 4   Years of education: Not on file   Highest education level: Not on file  Occupational History   Not on file  Tobacco Use   Smoking status: Never   Smokeless tobacco: Never  Vaping Use   Vaping Use: Never used  Substance and Sexual Activity   Alcohol use: Yes    Alcohol/week: 2.0 standard drinks    Types: 2 Standard drinks or equivalent per week    Comment: occasionally   Drug use: No   Sexual activity: Not Currently    Birth control/protection: None  Other Topics Concern   Not on file  Social History Narrative   Not on file   Social Determinants of Health   Financial Resource Strain: Not on file  Food Insecurity: Not on file  Transportation Needs: Not on  file  Physical Activity: Not on file  Stress: Not on file  Social Connections: Not on file  Intimate Partner Violence: Not on file    Vital Signs: Blood pressure 117/85, pulse 88, resp. rate 16, height  (1.575 m), weight 214 lb (97.1 kg), SpO2 98 %. Body mass index is 39.14 kg/m.    Examination: General Appearance: The patient is well-developed, well-nourished, and in no distress. Neck Circumference:  37cm Skin: Gross inspection of skin unremarkable. Head: normocephalic, no gross deformities. Eyes: no gross deformities noted. ENT: ears  appear grossly normal Neurologic: Alert and oriented. No involuntary movements.    EPWORTH SLEEPINESS SCALE:  Scale:  (0)= no chance of dozing; (1)= slight chance of dozing; (2)= moderate chance of dozing; (3)= high chance of dozing  Chance  Situtation    Sitting and reading: 2    Watching TV: 2    Sitting Inactive in public: 0    As a passenger in car: 2      Lying down to rest: 3    Sitting and talking: 0    Sitting quielty after lunch: 1    In a car, stopped in traffic: 0   TOTAL SCORE:   10 out of 24    SLEEP STUDIES:  PSG 10/05/21  -  overall AHI, 7.1,  Min SpO2 @ 88%,   CPAP COMPLIANCE DATA:  Date Range: 11/28/21  -  12/27/21  Average Daily Use: 4:49 hours  Median Use: 5:00 hours  Compliance for > 4 Hours: 73%  AHI: 0.1 respiratory events per hour  Days Used: 30/30  Mask Leak: 5.9 lpm  95th Percentile Pressure: 8 cmH2O    LABS: No results found for this or any previous visit (from the past 2160 hour(s)).  Radiology: US BREAST LTD UNI LEFT INC AXILLA  Result Date: 10/09/2021 CLINICAL DATA:  Recall from screening mammography, possible BILATERAL breast masses. EXAM: DIGITAL DIAGNOSTIC BILATERAL MAMMOGRAM WITH TOMOSYNTHESIS AND CAD; ULTRASOUND RIGHT BREAST LIMITED; ULTRASOUND LEFT BREAST LIMITED TECHNIQUE: Bilateral digital diagnostic mammography and breast tomosynthesis was performed. The images were evaluated with computer-aided detection.; Targeted ultrasound examination of the right breast was performed; Targeted ultrasound examination of the left breast was performed. COMPARISON:  Mammography 09/27/2021.  No prior ultrasound. ACR Breast Density Category c: The breast tissue is heterogeneously dense, which may obscure small masses. FINDINGS: Spot-compression CC and MLO views of the areas of concern in both breasts were obtained. RIGHT: Circumscribed isodense mass in the outer breast at anterior to middle depth measuring approximately 1 cm without  associated architectural distortion or suspicious calcifications. Circumscribed isodense mass versus focal duct ectasia involving the upper subareolar location. There are there is no associated architectural distortion or suspicious calcifications. Targeted ultrasound is performed, demonstrating an oval parallel anechoic mass with scattered internal echoes at the 9 o'clock position 5 cm from the nipple measuring approximately 1.1 x 0.7 x 0.8 cm, demonstrating posterior acoustic enhancement and no internal power Doppler flow, corresponding to the screening mammographic finding. Mild duct ectasia is present in the upper subareolar location at 12 o'clock. No dominant cyst, solid mass or abnormal acoustic shadowing is identified. LEFT: Circumscribed isodense mass in the UPPER INNER QUADRANT at middle depth measuring just over 1 cm in size without associated architectural distortion or suspicious calcifications. Targeted ultrasound is performed, demonstrating an oval parallel circumscribed anechoic mass with thin internal septations at the 10 o'clock position 6 cm from the nipple measuring approximately 1.3 x 1.1 x 1.1 cm, demonstrating posterior  acoustic enhancement and no internal power Doppler flow, corresponding to the screening mammographic finding. There is a benign cyst immediately adjacent to this dominant cyst. No suspicious solid mass or abnormal acoustic shadowing is identified. IMPRESSION: Benign cysts in both breasts which account for the screening mammographic findings. RECOMMENDATION: Screening mammogram in one year.(Code:SM-B-01Y) I have discussed the findings and recommendations with the patient. If applicable, a reminder letter will be sent to the patient regarding the next appointment. BI-RADS CATEGORY  2: Benign. Electronically Signed   By: Hulan Saas M.D.   On: 10/09/2021 11:00  US BREAST LTD UNI RIGHT INC AXILLA  Result Date: 10/09/2021 CLINICAL DATA:  Recall from screening mammography,  possible BILATERAL breast masses. EXAM: DIGITAL DIAGNOSTIC BILATERAL MAMMOGRAM WITH TOMOSYNTHESIS AND CAD; ULTRASOUND RIGHT BREAST LIMITED; ULTRASOUND LEFT BREAST LIMITED TECHNIQUE: Bilateral digital diagnostic mammography and breast tomosynthesis was performed. The images were evaluated with computer-aided detection.; Targeted ultrasound examination of the right breast was performed; Targeted ultrasound examination of the left breast was performed. COMPARISON:  Mammography 09/27/2021.  No prior ultrasound. ACR Breast Density Category c: The breast tissue is heterogeneously dense, which may obscure small masses. FINDINGS: Spot-compression CC and MLO views of the areas of concern in both breasts were obtained. RIGHT: Circumscribed isodense mass in the outer breast at anterior to middle depth measuring approximately 1 cm without associated architectural distortion or suspicious calcifications. Circumscribed isodense mass versus focal duct ectasia involving the upper subareolar location. There are there is no associated architectural distortion or suspicious calcifications. Targeted ultrasound is performed, demonstrating an oval parallel anechoic mass with scattered internal echoes at the 9 o'clock position 5 cm from the nipple measuring approximately 1.1 x 0.7 x 0.8 cm, demonstrating posterior acoustic enhancement and no internal power Doppler flow, corresponding to the screening mammographic finding. Mild duct ectasia is present in the upper subareolar location at 12 o'clock. No dominant cyst, solid mass or abnormal acoustic shadowing is identified. LEFT: Circumscribed isodense mass in the UPPER INNER QUADRANT at middle depth measuring just over 1 cm in size without associated architectural distortion or suspicious calcifications. Targeted ultrasound is performed, demonstrating an oval parallel circumscribed anechoic mass with thin internal septations at the 10 o'clock position 6 cm from the nipple measuring  approximately 1.3 x 1.1 x 1.1 cm, demonstrating posterior acoustic enhancement and no internal power Doppler flow, corresponding to the screening mammographic finding. There is a benign cyst immediately adjacent to this dominant cyst. No suspicious solid mass or abnormal acoustic shadowing is identified. IMPRESSION: Benign cysts in both breasts which account for the screening mammographic findings. RECOMMENDATION: Screening mammogram in one year.(Code:SM-B-01Y) I have discussed the findings and recommendations with the patient. If applicable, a reminder letter will be sent to the patient regarding the next appointment. BI-RADS CATEGORY  2: Benign. Electronically Signed   By: Hulan Saas M.D.   On: 10/09/2021 11:00  MM DIAG BREAST TOMO BILATERAL  Result Date: 10/09/2021 CLINICAL DATA:  Recall from screening mammography, possible BILATERAL breast masses. EXAM: DIGITAL DIAGNOSTIC BILATERAL MAMMOGRAM WITH TOMOSYNTHESIS AND CAD; ULTRASOUND RIGHT BREAST LIMITED; ULTRASOUND LEFT BREAST LIMITED TECHNIQUE: Bilateral digital diagnostic mammography and breast tomosynthesis was performed. The images were evaluated with computer-aided detection.; Targeted ultrasound examination of the right breast was performed; Targeted ultrasound examination of the left breast was performed. COMPARISON:  Mammography 09/27/2021.  No prior ultrasound. ACR Breast Density Category c: The breast tissue is heterogeneously dense, which may obscure small masses. FINDINGS: Spot-compression CC and MLO views of the areas of  concern in both breasts were obtained. RIGHT: Circumscribed isodense mass in the outer breast at anterior to middle depth measuring approximately 1 cm without associated architectural distortion or suspicious calcifications. Circumscribed isodense mass versus focal duct ectasia involving the upper subareolar location. There are there is no associated architectural distortion or suspicious calcifications. Targeted ultrasound  is performed, demonstrating an oval parallel anechoic mass with scattered internal echoes at the 9 o'clock position 5 cm from the nipple measuring approximately 1.1 x 0.7 x 0.8 cm, demonstrating posterior acoustic enhancement and no internal power Doppler flow, corresponding to the screening mammographic finding. Mild duct ectasia is present in the upper subareolar location at 12 o'clock. No dominant cyst, solid mass or abnormal acoustic shadowing is identified. LEFT: Circumscribed isodense mass in the UPPER INNER QUADRANT at middle depth measuring just over 1 cm in size without associated architectural distortion or suspicious calcifications. Targeted ultrasound is performed, demonstrating an oval parallel circumscribed anechoic mass with thin internal septations at the 10 o'clock position 6 cm from the nipple measuring approximately 1.3 x 1.1 x 1.1 cm, demonstrating posterior acoustic enhancement and no internal power Doppler flow, corresponding to the screening mammographic finding. There is a benign cyst immediately adjacent to this dominant cyst. No suspicious solid mass or abnormal acoustic shadowing is identified. IMPRESSION: Benign cysts in both breasts which account for the screening mammographic findings. RECOMMENDATION: Screening mammogram in one year.(Code:SM-B-01Y) I have discussed the findings and recommendations with the patient. If applicable, a reminder letter will be sent to the patient regarding the next appointment. BI-RADS CATEGORY  2: Benign. Electronically Signed   By: Hulan Saas M.D.   On: 10/09/2021 11:00   No results found.  No results found.    Assessment and Plan: Patient Active Problem List   Diagnosis Date Noted   OSA on CPAP 01/06/2022   CPAP use counseling 01/06/2022   OSA (obstructive sleep apnea) 08/26/2021   Obesity (BMI 30-39.9) 08/26/2021   Pre-diabetes 08/15/2021   Microcytic anemia 08/15/2021   Morbid obesity (HCC) 08/15/2021   Anxiety 03/20/2016    Depression 03/20/2016   Hyperlipidemia, unspecified 03/20/2016   1. OSA on CPAP The patient does tolerate PAP although is struggling with the mask and reports  benefit from PAP use. The patient was reminded how to clean equipment and advised to replace supplies routinely. The patient was also counselled on weight loss. The compliance is fair. The AHI is 0.1.   OSA increase compliance with pap/. [id number]  2. CPAP use counseling CPAP Counseling: had a lengthy discussion with the patient regarding the importance of PAP therapy in management of the sleep apnea. Patient appears to understand the risk factor reduction and also understands the risks associated with untreated sleep apnea. Patient will try to make a good faith effort to remain compliant with therapy. Also instructed the patient on proper cleaning of the device including the water must be changed daily if possible and use of distilled water is preferred. Patient understands that the machine should be regularly cleaned with appropriate recommended cleaning solutions that do not damage the PAP machine for example given white vinegar and water rinses. Other methods such as ozone treatment may not be as good as these simple methods to achieve cleaning.   3. Obesity (BMI 30-39.9) Obesity Counseling: Had a lengthy discussion regarding patients BMI and weight issues. Patient was instructed on portion control as well as increased activity. Also discussed caloric restrictions with trying to maintain intake less than 2000 Kcal. Discussions were  made in accordance with the 5As of weight management. Simple actions such as not eating late and if able to, taking a walk is suggested.     General Counseling: I have discussed the findings of the evaluation and examination with Beth [name].  I have also discussed any further diagnostic evaluation thatmay be needed or ordered today. Beth [name] verbalizes understanding of the findings of todays visit. We also reviewed her  medications today and discussed drug interactions and side effects including but not limited excessive drowsiness and altered mental states. We also discussed that there is always a risk not just to her but also people around her. she has been encouraged to call the office with any questions or concerns that should arise related to todays visit.  No orders of the defined types were placed in this encounter.       I have personally obtained a history, examined the patient, evaluated laboratory and imaging results, formulated the assessment and plan and placed orders. This patient was seen today by Emmaline Kluver, PA-C in collaboration with Dr. Freda Munro.   Yevonne Pax, MD [facility] Diplomate ABMS Pulmonary Critical Care Medicine and Sleep Medicine

## 2022-01-15 ENCOUNTER — Ambulatory Visit (INDEPENDENT_AMBULATORY_CARE_PROVIDER_SITE_OTHER): Payer: BC Managed Care – PPO | Admitting: Family Medicine

## 2022-01-15 ENCOUNTER — Encounter: Payer: Self-pay | Admitting: Family Medicine

## 2022-01-15 DIAGNOSIS — R7303 Prediabetes: Secondary | ICD-10-CM

## 2022-01-15 LAB — POCT GLYCOSYLATED HEMOGLOBIN (HGB A1C): Hemoglobin A1C: 5.4 % (ref 4.0–5.6)

## 2022-01-15 NOTE — Patient Instructions (Addendum)
Thank you for coming to the office today.  Hopefully can get Wegovy soon, let me know the best estimate  If you hear back and will have a definite date to get it then we can consider ozempic sample 0.5mg  weekly for 4 week pen if you want, we have limited supply  Check on the 1mg  dosage at the pharmacy if you can.  If you are not going to be able to continue wegovy for now we would offer switch to Saxenda daily injection or Contrave oral dosing options. Both should be covered.  Goal >4% body weight loss in 16 weeks or 4 months, approximately 220 lbs down by 8.8 lbs, is the goal, we are at about 5 lbs loss, so keep working on improving. Likely they may be willing to continue since med out of stock.  Recent Labs    08/15/21 1147 01/15/22 0942  HGBA1C 5.7* 5.4   START anti inflammatory topical - OTC Voltaren (generic Diclofenac) topical 2-4 times a day as needed for pain swelling of affected joint for 1-2 weeks or longer.   Please schedule a Follow-up Appointment to: Return in about 6 weeks (around 02/26/2022) for 6 week follow-up weight management if not improved.  If you have any other questions or concerns, please feel free to call the office or send a message through MyChart. You may also schedule an earlier appointment if necessary.  Additionally, you may be receiving a survey about your experience at our office within a few days to 1 week by e-mail or mail. We value your feedback.  04/29/2022, DO [facility], [facility]

## 2022-01-15 NOTE — Progress Notes (Signed)
Subjective:    Patient ID: Beth [name], female    DOB: October 11, 1970, 51 y.o.   MRN: [id number]  Beth [name] is a 51 y.o. female presenting on 01/15/2022 for Prediabetes and Weight Check   HPI  Morbid Obesity BMI >39 Pre-Diabetes Last A1c 5.7 (07/2021) Due for repeat lab   Initially started [name] 0.25mg  completed 4 weeks, then out for period, then dose increased to [facility] 0.5mg  weekly, she took it for 1 month then ran out and pharmacy was out of stock, she missed 1 week so far waiting on pharmacy to [facility].  Update with initial wt prior to med 220 lbs starting, first dose ordered Wegovy 0.25mg  on 10/23/21, now 3 months later 215 lbs.  Goal >4% weight 8.8 lbs, see below  She is without medication for 1+ week now due to out of stock at pharmacy. She had some delay initially getting the medicine. Interested to wait or try alternative. She will check with pharmacy on status and insurance and notify me when ready.  Admits keeps improving diet and lifestyle exercise factors.  Right Finger Pain 1 month with symptoms of Middle 3 finger above knuckle and at MCP, describes pain and stiffness at times. Some days mild some days worse. Right handed. Normally       09/16/2021    9:12 AM 08/15/2021   10:32 AM 01/20/2019    2:39 PM  Depression screen PHQ 2/9  Decreased Interest 1 2 1   Down, Depressed, Hopeless 1 1 1   PHQ - 2 Score 2 3 2   Altered sleeping 3 3 1   Tired, decreased energy 2 2 0  Change in appetite 1 1 2   Feeling bad or failure about yourself  1 0 0  Trouble concentrating 2 3 0  Moving slowly or fidgety/restless 0 0 0  Suicidal thoughts 0 0 0  PHQ-9 Score 11 12 5   Difficult doing work/chores Somewhat difficult Very difficult Somewhat difficult    Social History   Tobacco Use   Smoking status: Never   Smokeless tobacco: Never  Vaping Use   Vaping Use: Never used  Substance Use Topics   Alcohol use: Yes    Alcohol/week: 2.0 standard drinks    Types: 2 Standard  drinks or equivalent per week    Comment: occasionally   Drug use: No    Review of Systems Per HPI unless specifically indicated above     Objective:    BP 128/70   Pulse 86   Ht 5\' 2"  (1.575 m)   Wt 215 lb 6.4 oz (97.7 kg)   SpO2 97%   BMI 39.40 kg/m   Wt Readings from Last 3 Encounters:  01/15/22 215 lb 6.4 oz (97.7 kg)  01/06/22 214 lb (97.1 kg)  09/16/21 220 lb 12.8 oz (100.2 kg)    Physical Exam Vitals and nursing note reviewed.  Constitutional:      General: She is not in acute distress.    Appearance: Normal appearance. She is well-developed. She is obese. She is not diaphoretic.     Comments: Well-appearing, comfortable, cooperative  HENT:     Head: Normocephalic and atraumatic.  Eyes:     General:        Right eye: No discharge.        Left eye: No discharge.     Conjunctiva/sclera: Conjunctivae normal.  Cardiovascular:     Rate and Rhythm: Normal rate.  Pulmonary:     Effort: Pulmonary effort is normal.  Musculoskeletal:  Comments: R hand fingers not swollen. Non tender, no deformity.  Skin:    General: Skin is warm and dry.     Findings: No erythema or rash.  Neurological:     Mental Status: She is alert and oriented to person, place, and time.  Psychiatric:        Mood and Affect: Mood normal.        Behavior: Behavior normal.        Thought Content: Thought content normal.     Comments: Well groomed, good eye contact, normal speech and thoughts     Results for orders placed or performed in visit on 01/15/22  POCT HgB A1C  Result Value Ref Range   Hemoglobin A1C 5.4 4.0 - 5.6 %      Assessment & Plan:   Problem List Items Addressed This Visit     Pre-diabetes   Relevant Orders   POCT HgB A1C (Completed)   Morbid obesity (HCC) - Primary   Relevant Orders   POCT HgB A1C (Completed)     Hopefully can get Wegovy soon, let me know the best estimate  If you hear back and will have a definite date to get it then we can consider  ozempic sample 0.5mg  weekly for 4 week pen if you want, we have limited supply  Check on the 1mg  dosage at the pharmacy if you can.  If you are not going to be able to continue wegovy for now we would offer switch to Saxenda daily injection or Contrave oral dosing options. Both should be covered.  Goal >4% body weight loss in 16 weeks or 4 months, approximately 220 lbs down by 8.8 lbs, is the goal, we are at about 5 lbs loss, so keep working on improving. Likely they may be willing to continue since med out of stock - problem of lapse of medication coverage is due to pharmacy/back order stock issue and not something that you could control, being off medicine for periods of time week or more interrupting continuous therapy could delay success of weight loss.  R Hand fingers / MCP appear to have some stiffness from repetitive activities R Dominant hand. No sign of deformity or edema or other issue Consider future X-ray if indicated.  START anti inflammatory topical - OTC Voltaren (generic Diclofenac) topical 2-4 times a day as needed for pain swelling of affected joint for 1-2 weeks or longer.  No orders of the defined types were placed in this encounter.     Follow up plan: Return in about 6 weeks (around 02/26/2022) for 6 week follow-up weight management if not improved.   04/29/2022, DO [facility] 01/15/2022, 9:39 AM

## 2022-01-28 DIAGNOSIS — G4733 Obstructive sleep apnea (adult) (pediatric): Secondary | ICD-10-CM | POA: Diagnosis not present

## 2022-02-07 ENCOUNTER — Encounter: Payer: Self-pay | Admitting: Family Medicine

## 2022-02-12 MED ORDER — WEGOVY 1 MG/0.5ML ~~LOC~~ SOAJ: 1.0000 mg | SUBCUTANEOUS | 2 refills | Status: DC

## 2022-02-12 NOTE — Addendum Note (Signed)
Addended by: Smitty Cords on: 02/12/2022 05:34 PM   Modules accepted: Orders

## 2022-02-19 ENCOUNTER — Other Ambulatory Visit: Payer: Self-pay

## 2022-02-20 LAB — LIPID PANEL
Cholesterol: 193 mg/dL (ref <200)
HDL: 53 mg/dL (ref >=50)
LDL Cholesterol (Calc): 91 mg/dL (calc)
Non-HDL Cholesterol (Calc): 140 mg/dL (calc) — ABNORMAL HIGH (ref <130)
Total CHOL/HDL Ratio: 3.6 (calc) (ref <5.0)
Triglycerides: 368 mg/dL — ABNORMAL HIGH (ref <150)

## 2022-02-26 ENCOUNTER — Encounter: Payer: Self-pay | Admitting: Family Medicine

## 2022-02-26 ENCOUNTER — Ambulatory Visit (INDEPENDENT_AMBULATORY_CARE_PROVIDER_SITE_OTHER): Payer: BC Managed Care – PPO | Admitting: Family Medicine

## 2022-02-26 DIAGNOSIS — Z1211 Encounter for screening for malignant neoplasm of colon: Secondary | ICD-10-CM

## 2022-02-26 MED ORDER — SAXENDA 18 MG/3ML ~~LOC~~ SOPN: PEN_INJECTOR | SUBCUTANEOUS | 2 refills | Status: DC

## 2022-02-26 NOTE — Progress Notes (Addendum)
Subjective:    Patient ID: Beth [name], female    DOB: 08/26/1970, 51 y.o.   MRN: [id number]  Beth [name] is a 51 y.o. female presenting on 02/26/2022 for Obesity   HPI  Morbid Obesity BMI >40 Pre-Diabetes Last A1c 5.4 (12/2021)  Past history, Initially started [facility] 0.25mg  completed 4 weeks, then out for period, then dose increased to [facility] 0.5mg  weekly, she took it for 1 month then ran out and pharmacy was out of stock,    She is without medication for 6 weeks now due to out of stock at pharmacy. She had some delay initially getting the medicine. Interested to wait or try alternative.   Admits keeps improving diet and lifestyle exercise factors.  Could not get Wegovy due to pharmacy It was covered but not available.  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 Not on therapy     02/26/2022    8:50 AM 09/16/2021    9:12 AM 08/15/2021   10:32 AM  Depression screen PHQ 2/9  Decreased Interest 1 1 2   Down, Depressed, Hopeless 1 1 1   PHQ - 2 Score 2 2 3   Altered sleeping 2 3 3   Tired, decreased energy 0 2 2  Change in appetite 2 1 1   Feeling bad or failure about yourself  2 1 0  Trouble concentrating 0 2 3  Moving slowly or fidgety/restless 0 0 0  Suicidal thoughts 0 0 0  PHQ-9 Score 8 11 12   Difficult doing work/chores Very difficult Somewhat difficult Very difficult    Social History   Tobacco Use   Smoking status: Never   Smokeless tobacco: Never  Vaping Use   Vaping Use: Never used  Substance Use Topics   Alcohol use: Yes    Alcohol/week: 2.0 standard drinks of alcohol    Types: 2 Standard drinks or equivalent per week    Comment: occasionally   Drug use: No    Review of Systems Per HPI unless specifically indicated above     Objective:    BP 111/69 (BP Location: Left Arm, Patient Position: Sitting, Cuff Size: Large)   Pulse 88   Temp (!) 97.5 F (36.4 C) (Temporal)   Ht 5\' 2"  (1.575 m)   Wt  221 lb (100.2 kg)   SpO2 99%   BMI 40.42 kg/m   Wt Readings from Last 3 Encounters:  02/26/22 221 lb (100.2 kg)  01/15/22 215 lb 6.4 oz (97.7 kg)  01/06/22 214 lb (97.1 kg)    Physical Exam Vitals and nursing note reviewed.  Constitutional:      General: She is not in acute distress.    Appearance: Normal appearance. She is well-developed. She is obese. She is not diaphoretic.     Comments: Well-appearing, comfortable, cooperative  HENT:     Head: Normocephalic and atraumatic.  Eyes:     General:        Right eye: No discharge.        Left eye: No discharge.     Conjunctiva/sclera: Conjunctivae normal.  Cardiovascular:     Rate and Rhythm: Normal rate.  Pulmonary:     Effort: Pulmonary effort is normal.  Skin:    General: Skin is warm and dry.     Findings: No erythema or rash.  Neurological:     Mental Status: She is alert and oriented to person, place, and time.  Psychiatric:        Mood  and Affect: Mood normal.        Behavior: Behavior normal.        Thought Content: Thought content normal.     Comments: Well groomed, good eye contact, normal speech and thoughts    Results for orders placed or performed in visit on 02/19/22  Lipid panel  Result Value Ref Range   Cholesterol 193 <200 mg/dL   HDL 53 > OR = 50 mg/dL   Triglycerides 702 (H) <150 mg/dL   LDL Cholesterol (Calc) 91 mg/dL (calc)   Total CHOL/HDL Ratio 3.6 <5.0 (calc)   Non-HDL Cholesterol (Calc) 140 (H) <130 mg/dL (calc)      Assessment & Plan:   Problem List Items Addressed This Visit     Morbid obesity (HCC) - Primary   Relevant Medications   SAXENDA 18 MG/3ML SOPN   Other Visit Diagnoses     Screening for colon cancer       Relevant Orders   Cologuard       Orders Placed This Encounter  Procedures   Cologuard   Morbid obesity Complicated by pre Diabetes, Hyperlipidemia/Triglyercides  Failed Wegovy out of stock >6 weeks off med  Start new option GLP1 Saxenda daily dosing per rx  instructions, sent rx today, should be available preferred.  Goal >4% body weight loss in 16 weeks or 4 months  Starting weight 221 lbs today  Consider back up plan Contrave oral med if unable to obtain Saxenda    Meds ordered this encounter  Medications   SAXENDA 18 MG/3ML SOPN    Sig: Injection 0.6 mg into skin once daily for 1 week, as tolerated increase by increment of 0.6mg  every 1 week, max dose is 3mg  injection daily after 5 weeks.    Dispense:  15 mL    Refill:  2      Follow up plan: Return in about 4 months (around 06/29/2022) for 4 month follow-up Weight.   13/07/2022, DO [facility] 02/26/2022, 2:32 PM

## 2022-02-26 NOTE — Patient Instructions (Addendum)
Thank you for coming to the office today.  For Weight Loss / Obesity only  Wegovy (same as Ozempic) weekly injection - start 0.51m weekly, 1 dose per pen, single use, auto-injector  2. Saxenda - DAILY injection - start 0.668minjection DAILY, you can increase the dose by 1 notch or 0.6 mg per week, if you don't tolerate a dose, can reduce it the next day.  3. Contrave - oral medication, appetite suppression has wellbutrin/bupropion and naltrexone in it and it can also help with appetite, it is ordered through a speciality pharmacy.  If cannot get Saxenda covered or available at pharmacy, let me know - we can order Contrave pill.   Ordered the Cologuard (home kit) test for colon cancer screening. Stay tuned for further updates.  It will be shipped to you directly. If not received in 2-4 weeks, call [employer] company.   If you send it back and no results are received in 2-4 weeks, call [employer] company as well!   Colon Cancer Screening: - For all adults age 308+outine colon cancer screening is highly recommended.     - Recent guidelines from [place] starting age of 49 Early detection of colon cancer is important, because often there are no warning signs or symptoms, also if found early usually it can be cured. Late stage is hard to treat.   - If Cologuard is NEGATIVE, then it is good for 3 years before next due - If Cologuard is POSITIVE, then it is strongly advised to get a Colonoscopy, which allows the GI doctor to locate the source of the cancer or polyp (even very early stage) and treat it by removing it. ------------------------- Follow instructions to collect sample, you may call the company for any help or questions, 24/7 telephone support at [phone]  Please schedule a Follow-up Appointment to: Return in about 4 months (around 06/29/2022) for 4 month follow-up Weight.  If you have any other questions or concerns, please feel free to call the  office or send a message through [facility] may also schedule an earlier appointment if necessary.  Additionally, you may be receiving a survey about your experience at our office within a few days to 1 week by e-mail or mail. We value your feedback.  AlNobie PutnamDO [facility]

## 2022-02-27 DIAGNOSIS — G4733 Obstructive sleep apnea (adult) (pediatric): Secondary | ICD-10-CM | POA: Diagnosis not present

## 2022-03-06 DIAGNOSIS — G4733 Obstructive sleep apnea (adult) (pediatric): Secondary | ICD-10-CM | POA: Diagnosis not present

## 2022-03-09 NOTE — Progress Notes (Unsigned)
[facility] [address] [place], [place] [address]  Pulmonary Sleep Medicine   Office Visit Note  Patient Name: Beth SaxDaydee Wakeley DOB: 09/03/1970 MRN [id number]    Chief Complaint: Obstructive Sleep Apnea visit  Brief History:  Nidhi is seen today for a two month follow up on APAP The patient has a 7 month history of sleep apnea. Patient is not using PAP nightly.  The patient feels rested after sleeping with PAP.  The patient reports benefiting from PAP use. Reported sleepiness is improved and the Epworth Sleepiness Score is 8 out of 24. The patient occasionally take naps. The patient complains of the following: patient reports increased anxiety while using her full face mask.  The compliance download shows 13% compliance with an average use time of 2.5 hours. The AHI is 0.0  The patient does not complain of limb movements disrupting sleep.  ROS  General: (-) fever, (-) chills, (-) night sweat Nose and Sinuses: (-) nasal stuffiness or itchiness, (-) postnasal drip, (-) nosebleeds, (-) sinus trouble. Mouth and Throat: (-) sore throat, (-) hoarseness. Neck: (-) swollen glands, (-) enlarged thyroid, (-) neck pain. Respiratory: - cough, - shortness of breath, - wheezing. Neurologic: - numbness, - tingling. Psychiatric: + anxiety, - depression   Current Medication: Outpatient Encounter Medications as of 03/10/2022  Medication Sig   famotidine (PEPCID) 20 MG tablet Take 1 tablet (20 mg total) by mouth 2 (two) times daily.   Norethindrone Acetate-Ethinyl Estrad-FE (MICROGESTIN 24 FE) 1-20 MG-MCG(24) tablet Take 1 tablet by mouth daily.   SAXENDA 18 MG/3ML SOPN Injection 0.6 mg into skin once daily for 1 week, as tolerated increase by increment of 0.6mg  every 1 week, max dose is 3mg  injection daily after 5 weeks.   zolpidem (AMBIEN) 5 MG tablet Take 1-2 tablets (5-10 mg total) by mouth at bedtime as needed for sleep.   [DISCONTINUED] albuterol (PROVENTIL HFA;VENTOLIN HFA) 108  (90 Base) MCG/ACT inhaler Inhale 2 puffs into the lungs every 6 (six) hours as needed for wheezing or shortness of breath.   [DISCONTINUED] clonazePAM (KLONOPIN) 0.5 MG tablet Take 1 tablet by mouth as needed.   No facility-administered encounter medications on file as of 03/10/2022.    Surgical History: Past Surgical History:  Procedure Laterality Date   HERNIA REPAIR  06/11/2021    Medical History: Past Medical History:  Diagnosis Date   Anemia    Anxiety    Depression    GERD (gastroesophageal reflux disease)    Kidney stone    kidney stone    Family History: Non contributory to the present illness  Social History: Social History   Socioeconomic History   Marital status: Divorced    Spouse name: Not on file   Number of children: 4   Years of education: Not on file   Highest education level: Not on file  Occupational History   Not on file  Tobacco Use   Smoking status: Never   Smokeless tobacco: Never  Vaping Use   Vaping Use: Never used  Substance and Sexual Activity   Alcohol use: Yes    Alcohol/week: 2.0 standard drinks of alcohol    Types: 2 Standard drinks or equivalent per week    Comment: occasionally   Drug use: No   Sexual activity: Not Currently    Birth control/protection: None  Other Topics Concern   Not on file  Social History Narrative   Not on file   Social Determinants of Health   Financial Resource Strain:  Not on file  Food Insecurity: Not on file  Transportation Needs: Not on file  Physical Activity: Not on file  Stress: Not on file  Social Connections: Not on file  Intimate Partner Violence: Not on file    Vital Signs: There were no vitals taken for this visit. There is no height or weight on file to calculate BMI.    Examination: General Appearance: The patient is well-developed, well-nourished, and in no distress. Neck Circumference: 37cm Skin: Gross inspection of skin unremarkable. Head: normocephalic, no gross  deformities. Eyes: no gross deformities noted. ENT: ears appear grossly normal Neurologic: Alert and oriented. No involuntary movements.    EPWORTH SLEEPINESS SCALE:  Scale:  (0)= no chance of dozing; (1)= slight chance of dozing; (2)= moderate chance of dozing; (3)= high chance of dozing  Chance  Situtation    Sitting and reading: 1    Watching TV: 3    Sitting Inactive in public: 0    As a passenger in car: 1      Lying down to rest: 3    Sitting and talking: 0    Sitting quielty after lunch: 0    In a car, stopped in traffic: 0   TOTAL SCORE:   8 out of 24    SLEEP STUDIES:  PSG 10/05/21  -  overall AHI, 7.1,  Min SpO2 @ 88%,   CPAP COMPLIANCE DATA:  Date Range: 01/09/22-7/23/-23  Average Daily Use: 2.5 hours  Median Use: 2 hr 23 min  Compliance for > 4 Hours: 13% days  AHI: 0.0 respiratory events per hour  Days Used: 43/60  Mask Leak: 8.6  95th Percentile Pressure:          LABS: Recent Results (from the past 2160 hour(s))  POCT HgB A1C     Status: None   Collection Time: 01/15/22  9:42 AM  Result Value Ref Range   Hemoglobin A1C 5.4 4.0 - 5.6 %  Lipid panel     Status: Abnormal   Collection Time: 02/19/22  9:03 AM  Result Value Ref Range   Cholesterol 193 <200 mg/dL   HDL 53 > OR = 50 mg/dL   Triglycerides 979 (H) <150 mg/dL    Comment: . If a non-fasting specimen was collected, consider repeat triglyceride testing on a fasting specimen if clinically indicated.  Perry Mount et al. J. of Clin. Lipidol. 2015;9:129-169. Marland Kitchen    LDL Cholesterol (Calc) 91 mg/dL (calc)    Comment: Reference range: <100 . Desirable range <100 mg/dL for primary prevention;   <70 mg/dL for patients with CHD or diabetic patients  with > or = 2 CHD risk factors. Marland Kitchen LDL-C is now calculated using the Martin-Hopkins  calculation, which is a validated novel method providing  better accuracy than the Friedewald equation in the  estimation of LDL-C.  Horald Pollen  et al. Lenox Ahr. 8921;194(17): 2061-2068  (http://education.[employer].com/faq/FAQ164)    Total CHOL/HDL Ratio 3.6 <5.0 (calc)   Non-HDL Cholesterol (Calc) 140 (H) <130 mg/dL (calc)    Comment: For patients with diabetes plus 1 major ASCVD risk  factor, treating to a non-HDL-C goal of <100 mg/dL  (LDL-C of <40 mg/dL) is considered a therapeutic  option.     Radiology: MM DIAG BREAST TOMO BILATERAL  Result Date: 10/09/2021 CLINICAL DATA:  Recall from screening mammography, possible BILATERAL breast masses. EXAM: DIGITAL DIAGNOSTIC BILATERAL MAMMOGRAM WITH TOMOSYNTHESIS AND CAD; ULTRASOUND RIGHT BREAST LIMITED; ULTRASOUND LEFT BREAST LIMITED TECHNIQUE: Bilateral digital diagnostic mammography and breast tomosynthesis was  performed. The images were evaluated with computer-aided detection.; Targeted ultrasound examination of the right breast was performed; Targeted ultrasound examination of the left breast was performed. COMPARISON:  Mammography 09/27/2021.  No prior ultrasound. ACR Breast Density Category c: The breast tissue is heterogeneously dense, which may obscure small masses. FINDINGS: Spot-compression CC and MLO views of the areas of concern in both breasts were obtained. RIGHT: Circumscribed isodense mass in the outer breast at anterior to middle depth measuring approximately 1 cm without associated architectural distortion or suspicious calcifications. Circumscribed isodense mass versus focal duct ectasia involving the upper subareolar location. There are there is no associated architectural distortion or suspicious calcifications. Targeted ultrasound is performed, demonstrating an oval parallel anechoic mass with scattered internal echoes at the 9 o'clock position 5 cm from the nipple measuring approximately 1.1 x 0.7 x 0.8 cm, demonstrating posterior acoustic enhancement and no internal power Doppler flow, corresponding to the screening mammographic finding. Mild duct ectasia is present in  the upper subareolar location at 12 o'clock. No dominant cyst, solid mass or abnormal acoustic shadowing is identified. LEFT: Circumscribed isodense mass in the UPPER INNER QUADRANT at middle depth measuring just over 1 cm in size without associated architectural distortion or suspicious calcifications. Targeted ultrasound is performed, demonstrating an oval parallel circumscribed anechoic mass with thin internal septations at the 10 o'clock position 6 cm from the nipple measuring approximately 1.3 x 1.1 x 1.1 cm, demonstrating posterior acoustic enhancement and no internal power Doppler flow, corresponding to the screening mammographic finding. There is a benign cyst immediately adjacent to this dominant cyst. No suspicious solid mass or abnormal acoustic shadowing is identified. IMPRESSION: Benign cysts in both breasts which account for the screening mammographic findings. RECOMMENDATION: Screening mammogram in one year.(Code:SM-B-01Y) I have discussed the findings and recommendations with the patient. If applicable, a reminder letter will be sent to the patient regarding the next appointment. BI-RADS CATEGORY  2: Benign. Electronically Signed   By: Hulan Saas M.D.   On: 10/09/2021 11:00  US BREAST LTD UNI LEFT INC AXILLA  Result Date: 10/09/2021 CLINICAL DATA:  Recall from screening mammography, possible BILATERAL breast masses. EXAM: DIGITAL DIAGNOSTIC BILATERAL MAMMOGRAM WITH TOMOSYNTHESIS AND CAD; ULTRASOUND RIGHT BREAST LIMITED; ULTRASOUND LEFT BREAST LIMITED TECHNIQUE: Bilateral digital diagnostic mammography and breast tomosynthesis was performed. The images were evaluated with computer-aided detection.; Targeted ultrasound examination of the right breast was performed; Targeted ultrasound examination of the left breast was performed. COMPARISON:  Mammography 09/27/2021.  No prior ultrasound. ACR Breast Density Category c: The breast tissue is heterogeneously dense, which may obscure small masses.  FINDINGS: Spot-compression CC and MLO views of the areas of concern in both breasts were obtained. RIGHT: Circumscribed isodense mass in the outer breast at anterior to middle depth measuring approximately 1 cm without associated architectural distortion or suspicious calcifications. Circumscribed isodense mass versus focal duct ectasia involving the upper subareolar location. There are there is no associated architectural distortion or suspicious calcifications. Targeted ultrasound is performed, demonstrating an oval parallel anechoic mass with scattered internal echoes at the 9 o'clock position 5 cm from the nipple measuring approximately 1.1 x 0.7 x 0.8 cm, demonstrating posterior acoustic enhancement and no internal power Doppler flow, corresponding to the screening mammographic finding. Mild duct ectasia is present in the upper subareolar location at 12 o'clock. No dominant cyst, solid mass or abnormal acoustic shadowing is identified. LEFT: Circumscribed isodense mass in the UPPER INNER QUADRANT at middle depth measuring just over 1 cm in size without  associated architectural distortion or suspicious calcifications. Targeted ultrasound is performed, demonstrating an oval parallel circumscribed anechoic mass with thin internal septations at the 10 o'clock position 6 cm from the nipple measuring approximately 1.3 x 1.1 x 1.1 cm, demonstrating posterior acoustic enhancement and no internal power Doppler flow, corresponding to the screening mammographic finding. There is a benign cyst immediately adjacent to this dominant cyst. No suspicious solid mass or abnormal acoustic shadowing is identified. IMPRESSION: Benign cysts in both breasts which account for the screening mammographic findings. RECOMMENDATION: Screening mammogram in one year.(Code:SM-B-01Y) I have discussed the findings and recommendations with the patient. If applicable, a reminder letter will be sent to the patient regarding the next appointment.  BI-RADS CATEGORY  2: Benign. Electronically Signed   By: Hulan Saas M.D.   On: 10/09/2021 11:00  US BREAST LTD UNI RIGHT INC AXILLA  Result Date: 10/09/2021 CLINICAL DATA:  Recall from screening mammography, possible BILATERAL breast masses. EXAM: DIGITAL DIAGNOSTIC BILATERAL MAMMOGRAM WITH TOMOSYNTHESIS AND CAD; ULTRASOUND RIGHT BREAST LIMITED; ULTRASOUND LEFT BREAST LIMITED TECHNIQUE: Bilateral digital diagnostic mammography and breast tomosynthesis was performed. The images were evaluated with computer-aided detection.; Targeted ultrasound examination of the right breast was performed; Targeted ultrasound examination of the left breast was performed. COMPARISON:  Mammography 09/27/2021.  No prior ultrasound. ACR Breast Density Category c: The breast tissue is heterogeneously dense, which may obscure small masses. FINDINGS: Spot-compression CC and MLO views of the areas of concern in both breasts were obtained. RIGHT: Circumscribed isodense mass in the outer breast at anterior to middle depth measuring approximately 1 cm without associated architectural distortion or suspicious calcifications. Circumscribed isodense mass versus focal duct ectasia involving the upper subareolar location. There are there is no associated architectural distortion or suspicious calcifications. Targeted ultrasound is performed, demonstrating an oval parallel anechoic mass with scattered internal echoes at the 9 o'clock position 5 cm from the nipple measuring approximately 1.1 x 0.7 x 0.8 cm, demonstrating posterior acoustic enhancement and no internal power Doppler flow, corresponding to the screening mammographic finding. Mild duct ectasia is present in the upper subareolar location at 12 o'clock. No dominant cyst, solid mass or abnormal acoustic shadowing is identified. LEFT: Circumscribed isodense mass in the UPPER INNER QUADRANT at middle depth measuring just over 1 cm in size without associated architectural distortion  or suspicious calcifications. Targeted ultrasound is performed, demonstrating an oval parallel circumscribed anechoic mass with thin internal septations at the 10 o'clock position 6 cm from the nipple measuring approximately 1.3 x 1.1 x 1.1 cm, demonstrating posterior acoustic enhancement and no internal power Doppler flow, corresponding to the screening mammographic finding. There is a benign cyst immediately adjacent to this dominant cyst. No suspicious solid mass or abnormal acoustic shadowing is identified. IMPRESSION: Benign cysts in both breasts which account for the screening mammographic findings. RECOMMENDATION: Screening mammogram in one year.(Code:SM-B-01Y) I have discussed the findings and recommendations with the patient. If applicable, a reminder letter will be sent to the patient regarding the next appointment. BI-RADS CATEGORY  2: Benign. Electronically Signed   By: Hulan Saas M.D.   On: 10/09/2021 11:00   No results found.  No results found.    Assessment and Plan: Patient Active Problem List   Diagnosis Date Noted   OSA on CPAP 01/06/2022   CPAP use counseling 01/06/2022   OSA (obstructive sleep apnea) 08/26/2021   Obesity (BMI 30-39.9) 08/26/2021   Pre-diabetes 08/15/2021   Microcytic anemia 08/15/2021   Morbid obesity (HCC) 08/15/2021  Anxiety 03/20/2016   Depression 03/20/2016   Hyperlipidemia, unspecified 03/20/2016    1. OSA on CPAP The patient is struggling to  tolerate PAP due to claustrophobia. We discussed acclimation strategies which she will implement. She had previously done better when on zolpidem but truly does not wish to take that medication long term. She reports some claustrophobia in other predictable settings. She does report  benefit from PAP use.She will try to use cpap while awake/distracted and work towards using it 7 hours nightly as the goal.  The patient was reminded how to clean equipment and advised to replace supplies routinely. The  patient was also counselled on weight loss. The compliance is poor. The AHI is 0.   OSA on cpap- CPAP continues to be medically necessary to treat this patient's OSA.  She will work on acclimation. F/u 43m  2. CPAP use counseling CPAP Counseling: had a lengthy discussion with the patient regarding the importance of PAP therapy in management of the sleep apnea. Patient appears to understand the risk factor reduction and also understands the risks associated with untreated sleep apnea. Patient will try to make a good faith effort to remain compliant with therapy. Also instructed the patient on proper cleaning of the device including the water must be changed daily if possible and use of distilled water is preferred. Patient understands that the machine should be regularly cleaned with appropriate recommended cleaning solutions that do not damage the PAP machine for example given white vinegar and water rinses. Other methods such as ozone treatment may not be as good as these simple methods to achieve cleaning.   3. Obesity (BMI 30-39.9) Obesity Counseling: Had a lengthy discussion regarding patients BMI and weight issues. Patient was instructed on portion control as well as increased activity. Also discussed caloric restrictions with trying to maintain intake less than 2000 Kcal. Discussions were made in accordance with the 5As of weight management. Simple actions such as not eating late and if able to, taking a walk is suggested.    General Counseling: I have discussed the findings of the evaluation and examination with Norlene.  I have also discussed any further diagnostic evaluation thatmay be needed or ordered today. Corbin verbalizes understanding of the findings of todays visit. We also reviewed her medications today and discussed drug interactions and side effects including but not limited excessive drowsiness and altered mental states. We also discussed that there is always a risk not just to her but  also people around her. she has been encouraged to call the office with any questions or concerns that should arise related to todays visit.  No orders of the defined types were placed in this encounter.       I have personally obtained a history, examined the patient, evaluated laboratory and imaging results, formulated the assessment and plan and placed orders. This patient was seen today by Emmaline Kluver, PA-C in collaboration with Dr. Freda Munro.   Yevonne Pax, MD [facility] Diplomate ABMS Pulmonary Critical Care Medicine and Sleep Medicine

## 2022-03-10 ENCOUNTER — Ambulatory Visit (INDEPENDENT_AMBULATORY_CARE_PROVIDER_SITE_OTHER): Payer: BC Managed Care – PPO | Admitting: Internal Medicine

## 2022-03-10 VITALS — BP 115/81 | HR 92 | Resp 18 | Ht 62.0 in | Wt 222.8 lb

## 2022-03-10 DIAGNOSIS — G4733 Obstructive sleep apnea (adult) (pediatric): Secondary | ICD-10-CM

## 2022-03-10 DIAGNOSIS — E669 Obesity, unspecified: Secondary | ICD-10-CM

## 2022-03-10 DIAGNOSIS — Z9989 Dependence on other enabling machines and devices: Secondary | ICD-10-CM | POA: Diagnosis not present

## 2022-03-10 DIAGNOSIS — Z7189 Other specified counseling: Secondary | ICD-10-CM | POA: Diagnosis not present

## 2022-03-10 NOTE — Patient Instructions (Signed)

## 2022-04-06 DIAGNOSIS — G4733 Obstructive sleep apnea (adult) (pediatric): Secondary | ICD-10-CM | POA: Diagnosis not present

## 2022-04-10 ENCOUNTER — Telehealth: Payer: Self-pay | Admitting: Family Medicine

## 2022-04-10 DIAGNOSIS — Z1211 Encounter for screening for malignant neoplasm of colon: Secondary | ICD-10-CM | POA: Diagnosis not present

## 2022-04-10 NOTE — Telephone Encounter (Signed)
Beth [name] with [employer] is calling in regards to the Appeal request for the Saxenda.  She would like a nurse to call her back.    She would like the appeal to be faxed to [phone]  [facility]  [phone]  [place]  [id number]

## 2022-04-14 NOTE — Telephone Encounter (Signed)
Denied again

## 2022-04-18 LAB — COLOGUARD: COLOGUARD: NEGATIVE

## 2022-06-30 ENCOUNTER — Ambulatory Visit (INDEPENDENT_AMBULATORY_CARE_PROVIDER_SITE_OTHER): Payer: BC Managed Care – PPO | Admitting: Family Medicine

## 2022-06-30 ENCOUNTER — Encounter: Payer: Self-pay | Admitting: Family Medicine

## 2022-06-30 DIAGNOSIS — F5101 Primary insomnia: Secondary | ICD-10-CM

## 2022-06-30 DIAGNOSIS — R7303 Prediabetes: Secondary | ICD-10-CM

## 2022-06-30 MED ORDER — CONTRAVE 8-90 MG PO TB12: ORAL_TABLET | ORAL | 0 refills | Status: DC

## 2022-06-30 MED ORDER — ZOLPIDEM TARTRATE ER 6.25 MG PO TBCR: 6.2500 mg | EXTENDED_RELEASE_TABLET | Freq: Every evening | ORAL | 2 refills | Status: DC | PRN

## 2022-06-30 NOTE — Progress Notes (Signed)
Subjective:    Patient ID: Beth [name], female    DOB: 1970-12-27, 51 y.o.   MRN: [id number]  Beth [name] is a 51 y.o. female presenting on 06/30/2022 for Obesity and Insomnia   HPI  Insomnia Sleep maintenance problem May fall asleep fairly easily, will wake up every few hours it seems throughout the night No obvious symptoms waking her up Admits anxiety, things several things on the mind but unsure if this is the answer. Tried avoid screen time 1 hr before, white noise rain, tea, bath before. Tried OTC sleep med, melatonin, Zquill Not on anxiety med Admits peri menopausal  Morbid Obesity BMI >41 Pre-Diabetes Last A1c 5.4 (12/2021) Last visit 02/2022, tried to order Saxenda Weight gain 5 lbs in 4 months, 221 to 226  Past history, Initially started [facility] 0.25mg  completed 4 weeks, then out for period, then dose increased to [facility] 0.5mg  weekly, she took it for 1 month then ran out and pharmacy was out of stock Admits keeps improving diet and lifestyle exercise factors. Could not get Wegovy due to pharmacy Now unable to get Saxenda not covered by insurance, denied PA attempt  HyperTG Familial suspected   Health Maintenance: Flu Shot declined     02/26/2022    8:50 AM 09/16/2021    9:12 AM 08/15/2021   10:32 AM  Depression screen PHQ 2/9  Decreased Interest 1 1 2   Down, Depressed, Hopeless 1 1 1   PHQ - 2 Score 2 2 3   Altered sleeping 2 3 3   Tired, decreased energy 0 2 2  Change in appetite 2 1 1   Feeling bad or failure about yourself  2 1 0  Trouble concentrating 0 2 3  Moving slowly or fidgety/restless 0 0 0  Suicidal thoughts 0 0 0  PHQ-9 Score 8 11 12   Difficult doing work/chores Very difficult Somewhat difficult Very difficult    Social History   Tobacco Use   Smoking status: Never   Smokeless tobacco: Never  Vaping Use   Vaping Use: Never used  Substance Use Topics   Alcohol use: Yes    Alcohol/week: 2.0 standard drinks of alcohol    Types: 2  Standard drinks or equivalent per week    Comment: occasionally   Drug use: No    Review of Systems Per HPI unless specifically indicated above     Objective:    BP 132/81   Pulse 88   Ht 5\' 2"  (1.575 m)   Wt 226 lb (102.5 kg)   SpO2 98%   BMI 41.34 kg/m   Wt Readings from Last 3 Encounters:  06/30/22 226 lb (102.5 kg)  03/10/22 222 lb 12.8 oz (101.1 kg)  02/26/22 221 lb (100.2 kg)    Physical Exam Vitals and nursing note reviewed.  Constitutional:      General: She is not in acute distress.    Appearance: She is well-developed. She is obese. She is not diaphoretic.     Comments: Well-appearing, comfortable, cooperative  HENT:     Head: Normocephalic and atraumatic.  Eyes:     General:        Right eye: No discharge.        Left eye: No discharge.     Conjunctiva/sclera: Conjunctivae normal.  Neck:     Thyroid: No thyromegaly.  Cardiovascular:     Rate and Rhythm: Normal rate and regular rhythm.     Heart sounds: Normal heart sounds. No murmur heard. Pulmonary:     Effort:  Pulmonary effort is normal. No respiratory distress.     Breath sounds: Normal breath sounds. No wheezing or rales.  Musculoskeletal:        General: Normal range of motion.     Cervical back: Normal range of motion and neck supple.  Lymphadenopathy:     Cervical: No cervical adenopathy.  Skin:    General: Skin is warm and dry.     Findings: No erythema or rash.  Neurological:     Mental Status: She is alert and oriented to person, place, and time.  Psychiatric:        Behavior: Behavior normal.     Comments: Well groomed, good eye contact, normal speech and thoughts       Results for orders placed or performed in visit on 02/26/22  Cologuard  Result Value Ref Range   COLOGUARD Negative Negative      Assessment & Plan:   Problem List Items Addressed This Visit     Morbid obesity (HCC) - Primary   Relevant Medications   CONTRAVE 8-90 MG TB12   Pre-diabetes   Other Visit  Diagnoses     Primary insomnia       Relevant Medications   zolpidem (AMBIEN CR) 6.25 MG CR tablet       Morbid BMI >41 Limited wt loss on lifestyle diet exercise Unable to get Wegovy covered or Saxenda was denied  Contrave - oral medication, appetite suppression has wellbutrin/bupropion and naltrexone in it and it can also help with appetite, it is ordered through a speciality pharmacy. -  Mail order, stay tuned for med. Start 1 a day then go up to 1 twice a day, then 2 in AM and 1 in PM, then 2 in AM and 2 in PM.  Future reconsider other options in 2024, maybe other GIP/GLP  Insomnia Seems mostly primary Has some underlying anxiety component Previously on Zolpidem 5mg  IR AS NEEDED some relief Sleep hygiene New rx - Try the Zolpidem CR as needed. It is continued release for sleep maintenance. Consider anti anxiety or SSRI in future if prefers.  PreDM Due for repeat lab A1c next visit Encourage weight management as above  Meds ordered this encounter  Medications   zolpidem (AMBIEN CR) 6.25 MG CR tablet    Sig: Take 1 tablet (6.25 mg total) by mouth at bedtime as needed for sleep.    Dispense:  30 tablet    Refill:  2   CONTRAVE 8-90 MG TB12    Sig: Week 1: Take 1 tab daily with breakfast. Week 2: Take 1 tab twice daily with meal. Week 3: Take 2 tab with AM meal and 1 tab with PM meal. Week 4+: Take 2 tabs twice daily with meals - continue for weight loss    Dispense:  120 tablet    Refill:  0      Follow up plan: Return in about 3 months (around 09/30/2022) for 3 month Annual Physical AM fasting lab AFTER.    10/02/2022, DO [facility] 06/30/2022, 9:13 AM

## 2022-06-30 NOTE — Patient Instructions (Addendum)
Thank you for coming to the office today.  Try the Zolpidem CR as needed. It is continued release for sleep maintenance.  Contrave - oral medication, appetite suppression has wellbutrin/bupropion and naltrexone in it and it can also help with appetite, it is ordered through a speciality pharmacy. -  Mail order, stay tuned for med. Start 1 a day then go up to 1 twice a day, then 2 in AM and 1 in PM, then 2 in AM and 2 in PM.  Zepbound (new brand name version of the [facility] medicine, tirzepatide  generic) for Weight loss only FDA approval 06/2022  We can try to order next year.   Sleep Hygiene Recommendations to promote healthy sleep in all patients, especially if symptoms of insomnia are worsening. Due to the nature of sleep rhythms, if your body gets "out of rhythm", it may take some time before your sleep cycle can be "reset".  Please try to follow as many of the following tips as you can, usually there are only a few of these are the primary cause of the problem.  ?To reset your sleep rhythm, go to bed and get up at the same time every day ?Sleep only long enough to feel rested and then get out of bed ?Do not try to force yourself to sleep. If you can't sleep, get out of bed and try again later. ?Avoid naps during the day, unless excessively tired. The more sleeping during the day, then the less sleep your body needs at night.  ?Have coffee, tea, and other foods that have caffeine only in the morning ?Exercise several days a week, but not right before bed ?If you drink alcohol, prefer to have appropriate drink with one meal, but prefer to avoid alcohol in the evening, and bedtime ?If you smoke, avoid smoking, especially in the evening  ?Avoid watching TV or looking at phones, computers, or reading devices ("e-books") that give off light at least 30 minutes before bed. This artificial light sends "awake signals" to your brain and can make it harder to fall asleep. ?Make your bedroom a  comfortable place where it is easy to fall asleep: Put up shades or special blackout curtains to block light from outside. Use a white noise machine to block noise. Keep the temperature cool. ?Try your best to solve or at least address your problems before you go to bed ?Use relaxation techniques to manage stress. Ask your health care provider to suggest some techniques that may work well for you. These may include: Breathing exercises. Routines to release muscle tension. Visualizing peaceful scenes.   Please schedule a Follow-up Appointment to: Return in about 3 months (around 09/30/2022) for 3 month Annual Physical AM fasting lab AFTER.  If you have any other questions or concerns, please feel free to call the office or send a message through MyChart. You may also schedule an earlier appointment if necessary.  Additionally, you may be receiving a survey about your experience at our office within a few days to 1 week by e-mail or mail. We value your feedback.  Saralyn Pilar, DO [facility], [place]

## 2022-07-07 ENCOUNTER — Ambulatory Visit (INDEPENDENT_AMBULATORY_CARE_PROVIDER_SITE_OTHER): Payer: BC Managed Care – PPO | Admitting: Internal Medicine

## 2022-07-07 VITALS — BP 109/80 | HR 90 | Resp 14 | Ht 62.0 in | Wt 230.0 lb

## 2022-07-07 DIAGNOSIS — Z7189 Other specified counseling: Secondary | ICD-10-CM | POA: Diagnosis not present

## 2022-07-07 DIAGNOSIS — E669 Obesity, unspecified: Secondary | ICD-10-CM

## 2022-07-07 DIAGNOSIS — G4733 Obstructive sleep apnea (adult) (pediatric): Secondary | ICD-10-CM | POA: Diagnosis not present

## 2022-07-07 NOTE — Progress Notes (Signed)
[facility] [address] [place], [place] [address]  Pulmonary Sleep Medicine   Office Visit Note  Patient Name: Beth SaxDaydee [name] DOB: 10/22/1970 MRN [id number]    Chief Complaint: Obstructive Sleep Apnea visit  Brief History:  Beth [name] is seen today for a 4 month follow up on CPAP at 8 cmh20. The patient has a 9 month history of sleep apnea. Patient is not using PAP nightly.  The patient feels rested after sleeping with PAP.  The patient reports benefiting from PAP use. Reported sleepiness is improved and the Epworth Sleepiness Score is 10 out of 24. The patient does not take naps. The patient complains of the following: The patient was having difficulty getting to sleep and staying asleep.  She has been prescribed a sleep aid and has been sleeping better.  The compliance download shows 20% compliance with an average use time of 2 hours 21 minutes. The AHI is 0.0.  The patient does occasionally complain of limb movements disrupting sleep.  ROS  General: (-) fever, (-) chills, (-) night sweat Nose and Sinuses: (-) nasal stuffiness or itchiness, (-) postnasal drip, (-) nosebleeds, (-) sinus trouble. Mouth and Throat: (-) sore throat, (-) hoarseness. Neck: (-) swollen glands, (-) enlarged thyroid, (-) neck pain. Respiratory: - cough, - shortness of breath, - wheezing. Neurologic: - numbness, - tingling. Psychiatric: - anxiety, - depression   Current Medication: Outpatient Encounter Medications as of 07/07/2022  Medication Sig   CONTRAVE 8-90 MG TB12 Week 1: Take 1 tab daily with breakfast. Week 2: Take 1 tab twice daily with meal. Week 3: Take 2 tab with AM meal and 1 tab with PM meal. Week 4+: Take 2 tabs twice daily with meals - continue for weight loss   famotidine (PEPCID) 20 MG tablet Take 1 tablet (20 mg total) by mouth 2 (two) times daily.   Norethindrone Acetate-Ethinyl Estrad-FE (MICROGESTIN 24 FE) 1-20 MG-MCG(24) tablet Take 1 tablet by mouth daily.   zolpidem  (AMBIEN CR) 6.25 MG CR tablet Take 1 tablet (6.25 mg total) by mouth at bedtime as needed for sleep.   [DISCONTINUED] albuterol (PROVENTIL HFA;VENTOLIN HFA) 108 (90 Base) MCG/ACT inhaler Inhale 2 puffs into the lungs every 6 (six) hours as needed for wheezing or shortness of breath.   [DISCONTINUED] clonazePAM (KLONOPIN) 0.5 MG tablet Take 1 tablet by mouth as needed.   No facility-administered encounter medications on file as of 07/07/2022.    Surgical History: Past Surgical History:  Procedure Laterality Date   HERNIA REPAIR  06/11/2021    Medical History: Past Medical History:  Diagnosis Date   Anemia    Anxiety    Depression    GERD (gastroesophageal reflux disease)    Kidney stone    kidney stone    Family History: Non contributory to the present illness  Social History: Social History   Socioeconomic History   Marital status: Divorced    Spouse name: Not on file   Number of children: 4   Years of education: Not on file   Highest education level: Not on file  Occupational History   Not on file  Tobacco Use   Smoking status: Never   Smokeless tobacco: Never  Vaping Use   Vaping Use: Never used  Substance and Sexual Activity   Alcohol use: Yes    Alcohol/week: 2.0 standard drinks of alcohol    Types: 2 Standard drinks or equivalent per week    Comment: occasionally   Drug use: No   Sexual  activity: Not Currently    Birth control/protection: None  Other Topics Concern   Not on file  Social History Narrative   Not on file   Social Determinants of Health   Financial Resource Strain: Not on file  Food Insecurity: Not on file  Transportation Needs: Not on file  Physical Activity: Not on file  Stress: Not on file  Social Connections: Not on file  Intimate Partner Violence: Not on file    Vital Signs: Blood pressure 109/80, pulse 90, resp. rate 14, height  (1.575 m), weight 230 lb (104.3 kg), SpO2 96 %. Body mass index is 42.07 kg/m.     Examination: General Appearance: The patient is well-developed, well-nourished, and in no distress. Neck Circumference: 37 cm Skin: Gross inspection of skin unremarkable. Head: normocephalic, no gross deformities. Eyes: no gross deformities noted. ENT: ears appear grossly normal Neurologic: Alert and oriented. No involuntary movements.  STOP BANG RISK ASSESSMENT S (snore) Have you been told that you snore?     NO   T (tired) Are you often tired, fatigued, or sleepy during the day?   NO  O (obstruction) Do you stop breathing, choke, or gasp during sleep? YES   P (pressure) Do you have or are you being treated for high blood pressure? NO   B (BMI) Is your body index greater than 35 kg/m? YES   A (age) Are you 69 years old or older? NO   N (neck) Do you have a neck circumference greater than 16 inches?   NO   G (gender) Are you a female? NO   TOTAL STOP/BANG "YES" ANSWERS 2       A STOP-Bang score of 2 or less is considered low risk, and a score of 5 or more is high risk for having either moderate or severe OSA. For people who score 3 or 4, doctors may need to perform further assessment to determine how likely they are to have OSA.         EPWORTH SLEEPINESS SCALE:  Scale:  (0)= no chance of dozing; (1)= slight chance of dozing; (2)= moderate chance of dozing; (3)= high chance of dozing  Chance  Situtation    Sitting and reading: 1    Watching TV: 3    Sitting Inactive in public: 0    As a passenger in car: 2      Lying down to rest: 3    Sitting and talking: 0    Sitting quielty after lunch: 1    In a car, stopped in traffic: 0   TOTAL SCORE:   10 out of 24    SLEEP STUDIES:  PSG (10/05/21) AHI 7.1, SPO2 88%   CPAP COMPLIANCE DATA:  Date Range: 03/05/22 - 07/02/22  Average Daily Use: 2 hours 21 minutes  Median Use: 2 hours 46 minutes  Compliance for > 4 Hours: 24 days  AHI: 0.0 respiratory events per hour  Days Used: 95/120  Mask Leak:  11.4  95th Percentile Pressure: 8 cmh20         LABS: Recent Results (from the past 2160 hour(s))  Cologuard     Status: None   Collection Time: 04/10/22  9:43 AM  Result Value Ref Range   COLOGUARD Negative Negative    Comment:  NEGATIVE TEST RESULT. A negative Cologuard result indicates a low likelihood that a colorectal cancer (CRC) or advanced adenoma (adenomatous polyps with more advanced pre-malignant features)  is present. The chance that a person  with a negative Cologuard test has a colorectal cancer is less than 1 in 1500 (negative predictive value >99.9%) or has an  advanced adenoma is less than  5.3% (negative predictive value 94.7%). These data are based on a prospective cross-sectional study of 10,000 individuals at average risk for colorectal cancer who were screened with both Cologuard and colonoscopy. (Imperiale T. et al, N Engl J Med 2014;370(14):1286-1297) The normal value (reference range) for this assay is negative.  COLOGUARD RE-SCREENING RECOMMENDATION: Periodic colorectal cancer screening is an important part of preventive healthcare for asymptomatic individuals at average risk for colorectal cancer.  Following a negative Cologuard result, the American Cancer Society and U.S.  Multi-Society Task Force screening guidelines recommend a Cologuard re-screening interval of 3 years.  References: American Cancer Society Guideline for Colorectal Cancer Screening: https://www.cancer.org/cancer/colon-rectal-cancer/detection-diagnosis-staging/acs-recommendations.html.; Rex DK, Boland CR, Dominitz JK, Colorectal Cancer Screening: Recommendations for Physicians and Patients from the U.S. Multi-Society Task Force on Colorectal Cancer Screening , Am J Gastroenterology 2017; 112:1016-1030.  TEST DESCRIPTION: Composite algorithmic analysis of stool DNA-biomarkers with hemoglobin immunoassay.   Quantitative values of individual biomarkers are not reportable and are not associated with  individual biomarker result reference ranges. Cologuard is intended for colorectal cancer screening of adults of either sex, 45 years or older, who are at average-risk for colorectal cancer (CRC). Cologuard has been approved for use by the U.S. FDA. The performance of Cologuard was  established in a cross sectional study of average-risk adults aged 80-84. Cologuard performance in patients ages 8 to 77 years was estimated by sub-group analysis of near-age groups. Colonoscopies performed for a positive result may find as the most clinically significant lesion: colorectal cancer [4.0%], advanced adenoma (including sessile serrated polyps greater than or equal to 1cm diameter) [20%] or non- advanced adenoma [31%]; or no colorectal neoplasia [45%]. These estimates are derived from a prospective cross-sectional screening study of 10,000 individuals at average risk for colorectal cancer who were screened with both Cologuard and colonoscopy. (Imperiale T. et al, Macy Mis J Med 2014;370(14):1286-1297.) Cologuard may produce a false negative or false positive result (no colorectal cancer or precancerous polyp present at colonoscopy follow up). A negative Cologuard test result does not guarantee the absence of CRC or advanced adenoma (pre-cancer). The current Cologuard  screening interval is every 3 years. Science writer and U.S. Therapist, music). Cologuard performance data in a 10,000 patient pivotal study using colonoscopy as the reference method can be accessed at the following location: www.exactlabs.com/results. Additional description of the Cologuard test process, warnings and precautions can be found at www.cologuard.com.     Radiology: MM DIAG BREAST TOMO BILATERAL  Result Date: 10/09/2021 CLINICAL DATA:  Recall from screening mammography, possible BILATERAL breast masses. EXAM: DIGITAL DIAGNOSTIC BILATERAL MAMMOGRAM WITH TOMOSYNTHESIS AND CAD; ULTRASOUND RIGHT BREAST LIMITED; ULTRASOUND LEFT  BREAST LIMITED TECHNIQUE: Bilateral digital diagnostic mammography and breast tomosynthesis was performed. The images were evaluated with computer-aided detection.; Targeted ultrasound examination of the right breast was performed; Targeted ultrasound examination of the left breast was performed. COMPARISON:  Mammography 09/27/2021.  No prior ultrasound. ACR Breast Density Category c: The breast tissue is heterogeneously dense, which may obscure small masses. FINDINGS: Spot-compression CC and MLO views of the areas of concern in both breasts were obtained. RIGHT: Circumscribed isodense mass in the outer breast at anterior to middle depth measuring approximately 1 cm without associated architectural distortion or suspicious calcifications. Circumscribed isodense mass versus focal duct ectasia involving the upper subareolar location. There are there is no  associated architectural distortion or suspicious calcifications. Targeted ultrasound is performed, demonstrating an oval parallel anechoic mass with scattered internal echoes at the 9 o'clock position 5 cm from the nipple measuring approximately 1.1 x 0.7 x 0.8 cm, demonstrating posterior acoustic enhancement and no internal power Doppler flow, corresponding to the screening mammographic finding. Mild duct ectasia is present in the upper subareolar location at 12 o'clock. No dominant cyst, solid mass or abnormal acoustic shadowing is identified. LEFT: Circumscribed isodense mass in the UPPER INNER QUADRANT at middle depth measuring just over 1 cm in size without associated architectural distortion or suspicious calcifications. Targeted ultrasound is performed, demonstrating an oval parallel circumscribed anechoic mass with thin internal septations at the 10 o'clock position 6 cm from the nipple measuring approximately 1.3 x 1.1 x 1.1 cm, demonstrating posterior acoustic enhancement and no internal power Doppler flow, corresponding to the screening mammographic  finding. There is a benign cyst immediately adjacent to this dominant cyst. No suspicious solid mass or abnormal acoustic shadowing is identified. IMPRESSION: Benign cysts in both breasts which account for the screening mammographic findings. RECOMMENDATION: Screening mammogram in one year.(Code:SM-B-01Y) I have discussed the findings and recommendations with the patient. If applicable, a reminder letter will be sent to the patient regarding the next appointment. BI-RADS CATEGORY  2: Benign. Electronically Signed   By: Hulan Saas M.D.   On: 10/09/2021 11:00  US BREAST LTD UNI LEFT INC AXILLA  Result Date: 10/09/2021 CLINICAL DATA:  Recall from screening mammography, possible BILATERAL breast masses. EXAM: DIGITAL DIAGNOSTIC BILATERAL MAMMOGRAM WITH TOMOSYNTHESIS AND CAD; ULTRASOUND RIGHT BREAST LIMITED; ULTRASOUND LEFT BREAST LIMITED TECHNIQUE: Bilateral digital diagnostic mammography and breast tomosynthesis was performed. The images were evaluated with computer-aided detection.; Targeted ultrasound examination of the right breast was performed; Targeted ultrasound examination of the left breast was performed. COMPARISON:  Mammography 09/27/2021.  No prior ultrasound. ACR Breast Density Category c: The breast tissue is heterogeneously dense, which may obscure small masses. FINDINGS: Spot-compression CC and MLO views of the areas of concern in both breasts were obtained. RIGHT: Circumscribed isodense mass in the outer breast at anterior to middle depth measuring approximately 1 cm without associated architectural distortion or suspicious calcifications. Circumscribed isodense mass versus focal duct ectasia involving the upper subareolar location. There are there is no associated architectural distortion or suspicious calcifications. Targeted ultrasound is performed, demonstrating an oval parallel anechoic mass with scattered internal echoes at the 9 o'clock position 5 cm from the nipple measuring  approximately 1.1 x 0.7 x 0.8 cm, demonstrating posterior acoustic enhancement and no internal power Doppler flow, corresponding to the screening mammographic finding. Mild duct ectasia is present in the upper subareolar location at 12 o'clock. No dominant cyst, solid mass or abnormal acoustic shadowing is identified. LEFT: Circumscribed isodense mass in the UPPER INNER QUADRANT at middle depth measuring just over 1 cm in size without associated architectural distortion or suspicious calcifications. Targeted ultrasound is performed, demonstrating an oval parallel circumscribed anechoic mass with thin internal septations at the 10 o'clock position 6 cm from the nipple measuring approximately 1.3 x 1.1 x 1.1 cm, demonstrating posterior acoustic enhancement and no internal power Doppler flow, corresponding to the screening mammographic finding. There is a benign cyst immediately adjacent to this dominant cyst. No suspicious solid mass or abnormal acoustic shadowing is identified. IMPRESSION: Benign cysts in both breasts which account for the screening mammographic findings. RECOMMENDATION: Screening mammogram in one year.(Code:SM-B-01Y) I have discussed the findings and recommendations with the patient. If applicable,  a reminder letter will be sent to the patient regarding the next appointment. BI-RADS CATEGORY  2: Benign. Electronically Signed   By: Hulan Saas M.D.   On: 10/09/2021 11:00  US BREAST LTD UNI RIGHT INC AXILLA  Result Date: 10/09/2021 CLINICAL DATA:  Recall from screening mammography, possible BILATERAL breast masses. EXAM: DIGITAL DIAGNOSTIC BILATERAL MAMMOGRAM WITH TOMOSYNTHESIS AND CAD; ULTRASOUND RIGHT BREAST LIMITED; ULTRASOUND LEFT BREAST LIMITED TECHNIQUE: Bilateral digital diagnostic mammography and breast tomosynthesis was performed. The images were evaluated with computer-aided detection.; Targeted ultrasound examination of the right breast was performed; Targeted ultrasound  examination of the left breast was performed. COMPARISON:  Mammography 09/27/2021.  No prior ultrasound. ACR Breast Density Category c: The breast tissue is heterogeneously dense, which may obscure small masses. FINDINGS: Spot-compression CC and MLO views of the areas of concern in both breasts were obtained. RIGHT: Circumscribed isodense mass in the outer breast at anterior to middle depth measuring approximately 1 cm without associated architectural distortion or suspicious calcifications. Circumscribed isodense mass versus focal duct ectasia involving the upper subareolar location. There are there is no associated architectural distortion or suspicious calcifications. Targeted ultrasound is performed, demonstrating an oval parallel anechoic mass with scattered internal echoes at the 9 o'clock position 5 cm from the nipple measuring approximately 1.1 x 0.7 x 0.8 cm, demonstrating posterior acoustic enhancement and no internal power Doppler flow, corresponding to the screening mammographic finding. Mild duct ectasia is present in the upper subareolar location at 12 o'clock. No dominant cyst, solid mass or abnormal acoustic shadowing is identified. LEFT: Circumscribed isodense mass in the UPPER INNER QUADRANT at middle depth measuring just over 1 cm in size without associated architectural distortion or suspicious calcifications. Targeted ultrasound is performed, demonstrating an oval parallel circumscribed anechoic mass with thin internal septations at the 10 o'clock position 6 cm from the nipple measuring approximately 1.3 x 1.1 x 1.1 cm, demonstrating posterior acoustic enhancement and no internal power Doppler flow, corresponding to the screening mammographic finding. There is a benign cyst immediately adjacent to this dominant cyst. No suspicious solid mass or abnormal acoustic shadowing is identified. IMPRESSION: Benign cysts in both breasts which account for the screening mammographic findings.  RECOMMENDATION: Screening mammogram in one year.(Code:SM-B-01Y) I have discussed the findings and recommendations with the patient. If applicable, a reminder letter will be sent to the patient regarding the next appointment. BI-RADS CATEGORY  2: Benign. Electronically Signed   By: Hulan Saas M.D.   On: 10/09/2021 11:00   No results found.  No results found.    Assessment and Plan: Patient Active Problem List   Diagnosis Date Noted   OSA on CPAP 01/06/2022   CPAP use counseling 01/06/2022   OSA (obstructive sleep apnea) 08/26/2021   Obesity (BMI 30-39.9) 08/26/2021   Pre-diabetes 08/15/2021   Microcytic anemia 08/15/2021   Morbid obesity (HCC) 08/15/2021   Anxiety 03/20/2016   Depression 03/20/2016   Hyperlipidemia, unspecified 03/20/2016    1. OSA on CPAP The patient does tolerate PAP and reports  benefit from PAP use. The patient was reminded how to clean equipment  and advised to replace supplies routinely. The patient was also counselled on weight loss. The compliance is poor, but improving. The AHI is 0.   OSA on cpap- controlled. CPAP continues to be medically necessary to treat this patient's OSA.  Increase compliance with pap. F/u one year.   2. CPAP use counseling CPAP Counseling: had a lengthy discussion with the patient regarding the importance of  PAP therapy in management of the sleep apnea. Patient appears to understand the risk factor reduction and also understands the risks associated with untreated sleep apnea. Patient will try to make a good faith effort to remain compliant with therapy. Also instructed the patient on proper cleaning of the device including the water must be changed daily if possible and use of distilled water is preferred. Patient understands that the machine should be regularly cleaned with appropriate recommended cleaning solutions that do not damage the PAP machine for example given white vinegar and water rinses. Other methods such as ozone  treatment may not be as good as these simple methods to achieve cleaning.   3. Obesity (BMI 30-39.9) Obesity Counseling: Had a lengthy discussion regarding patients BMI and weight issues. Patient was instructed on portion control as well as increased activity. Also discussed caloric restrictions with trying to maintain intake less than 2000 Kcal. Discussions were made in accordance with the 5As of weight management. Simple actions such as not eating late and if able to, taking a walk is suggested.      General Counseling: I have discussed the findings of the evaluation and examination with Beth [name].  I have also discussed any further diagnostic evaluation thatmay be needed or ordered today. Beth [name] verbalizes understanding of the findings of todays visit. We also reviewed her medications today and discussed drug interactions and side effects including but not limited excessive drowsiness and altered mental states. We also discussed that there is always a risk not just to her but also people around her. she has been encouraged to call the office with any questions or concerns that should arise related to todays visit.  No orders of the defined types were placed in this encounter.       I have personally obtained a history, examined the patient, evaluated laboratory and imaging results, formulated the assessment and plan and placed orders. This patient was seen today by Emmaline Kluver, PA-C in collaboration with Dr. Freda Munro.   Yevonne Pax, MD [facility] Diplomate ABMS Pulmonary Critical Care Medicine and Sleep Medicine

## 2022-07-07 NOTE — Patient Instructions (Signed)

## 2022-07-29 ENCOUNTER — Other Ambulatory Visit: Payer: Self-pay | Admitting: Family Medicine

## 2022-07-30 NOTE — Telephone Encounter (Signed)
Dc'd 11/28/21 Dr. Althea Charon  Requested Prescriptions  Refused Prescriptions Disp Refills   WEGOVY 0.25 MG/0.5ML SOAJ [Pharmacy Med Name: WEGOVY 0.25 MG/0.5 ML PEN]      Sig: INJECT 0.25MG  INTO THE SKIN ONE TIME PER WEEK     Endocrinology:  Diabetes - GLP-1 Receptor Agonists - semaglutide Failed - 07/29/2022  6:39 PM      Failed - HBA1C in normal range and within 180 days    Hemoglobin A1C  Date Value Ref Range Status  01/15/2022 5.4 4.0 - 5.6 % Final   Hgb A1c MFr Bld  Date Value Ref Range Status  08/15/2021 5.7 (H) <5.7 % of total Hgb Final    Comment:    For someone without known diabetes, a hemoglobin  A1c value between 5.7% and 6.4% is consistent with prediabetes and should be confirmed with a  follow-up test. . For someone with known diabetes, a value <7% indicates that their diabetes is well controlled. A1c targets should be individualized based on duration of diabetes, age, comorbid conditions, and other considerations. . This assay result is consistent with an increased risk of diabetes. . Currently, no consensus exists regarding use of hemoglobin A1c for diagnosis of diabetes for children. .          Failed - Cr in normal range and within 360 days    No results found for: "CREATININE", "LABCREAU", "LABCREA", "POCCRE"       Passed - Valid encounter within last 6 months    Recent Outpatient Visits           1 month ago Morbid obesity [facility])   [facility] Smitty Cords, DO   5 months ago Morbid obesity [facility])   [facility] Smitty Cords, DO   6 months ago Morbid obesity [facility])   [facility] Smitty Cords, DO   10 months ago Pre-diabetes   [facility] Smitty Cords, DO   11 months ago Pre-diabetes   [facility] Althea Charon, Netta Neat, DO       Future Appointments             In 2 months Althea Charon, Netta Neat, DO [facility], [facility]

## 2022-08-27 DIAGNOSIS — S8002XA Contusion of left knee, initial encounter: Secondary | ICD-10-CM | POA: Diagnosis not present

## 2022-08-27 DIAGNOSIS — S92511A Displaced fracture of proximal phalanx of right lesser toe(s), initial encounter for closed fracture: Secondary | ICD-10-CM | POA: Diagnosis not present

## 2022-09-01 DIAGNOSIS — S92511A Displaced fracture of proximal phalanx of right lesser toe(s), initial encounter for closed fracture: Secondary | ICD-10-CM | POA: Diagnosis not present

## 2022-09-15 DIAGNOSIS — S92511A Displaced fracture of proximal phalanx of right lesser toe(s), initial encounter for closed fracture: Secondary | ICD-10-CM | POA: Diagnosis not present

## 2022-09-30 ENCOUNTER — Encounter: Payer: Self-pay | Admitting: Family Medicine

## 2022-09-30 ENCOUNTER — Encounter: Payer: BC Managed Care – PPO | Admitting: Family Medicine

## 2022-09-30 DIAGNOSIS — F5101 Primary insomnia: Secondary | ICD-10-CM

## 2022-10-01 MED ORDER — ZOLPIDEM TARTRATE ER 6.25 MG PO TBCR: 6.2500 mg | EXTENDED_RELEASE_TABLET | Freq: Every evening | ORAL | 5 refills | Status: DC | PRN

## 2022-10-04 ENCOUNTER — Other Ambulatory Visit: Payer: Self-pay | Admitting: Family Medicine

## 2022-10-04 DIAGNOSIS — F5101 Primary insomnia: Secondary | ICD-10-CM

## 2022-10-06 NOTE — Telephone Encounter (Signed)
Unable to refill per protocol, Rx request is too soon. Last refill 10/01/22 for 30 and 11 refills.  Requested Prescriptions  Pending Prescriptions Disp Refills   zolpidem (AMBIEN CR) 6.25 MG CR tablet [Pharmacy Med Name: ZOLPIDEM TART ER 6.25 MG TAB] 30 tablet 2    Sig: TAKE 1 TABLET BY MOUTH AT BEDTIME AS NEEDED FOR SLEEP.     Not Delegated - Psychiatry:  Anxiolytics/Hypnotics Failed - 10/04/2022  2:15 PM      Failed - This refill cannot be delegated      Failed - Urine Drug Screen completed in last 360 days      Passed - Valid encounter within last 6 months    Recent Outpatient Visits           3 months ago Morbid obesity [facility])   [place], DO   7 months ago Morbid obesity [facility])   [place], [place]   8 months ago Morbid obesity [facility])   [place], DO   1 year ago [place], DO   1 year ago [place], Devonne Dought[place], [place]

## 2022-10-13 DIAGNOSIS — S92511A Displaced fracture of proximal phalanx of right lesser toe(s), initial encounter for closed fracture: Secondary | ICD-10-CM | POA: Diagnosis not present

## 2022-11-05 ENCOUNTER — Encounter: Payer: Self-pay | Admitting: Family Medicine

## 2022-11-05 ENCOUNTER — Ambulatory Visit (INDEPENDENT_AMBULATORY_CARE_PROVIDER_SITE_OTHER): Payer: BC Managed Care – PPO | Admitting: Family Medicine

## 2022-11-05 VITALS — BP 124/70 | HR 96 | Ht 62.0 in | Wt 231.8 lb

## 2022-11-05 DIAGNOSIS — E782 Mixed hyperlipidemia: Secondary | ICD-10-CM | POA: Diagnosis not present

## 2022-11-05 DIAGNOSIS — F3341 Major depressive disorder, recurrent, in partial remission: Secondary | ICD-10-CM

## 2022-11-05 DIAGNOSIS — J329 Chronic sinusitis, unspecified: Secondary | ICD-10-CM

## 2022-11-05 DIAGNOSIS — F5101 Primary insomnia: Secondary | ICD-10-CM | POA: Diagnosis not present

## 2022-11-05 DIAGNOSIS — R7303 Prediabetes: Secondary | ICD-10-CM

## 2022-11-05 DIAGNOSIS — D509 Iron deficiency anemia, unspecified: Secondary | ICD-10-CM | POA: Diagnosis not present

## 2022-11-05 DIAGNOSIS — Z0000 Encounter for general adult medical examination without abnormal findings: Secondary | ICD-10-CM | POA: Diagnosis not present

## 2022-11-05 DIAGNOSIS — Z1231 Encounter for screening mammogram for malignant neoplasm of breast: Secondary | ICD-10-CM

## 2022-11-05 DIAGNOSIS — E559 Vitamin D deficiency, unspecified: Secondary | ICD-10-CM

## 2022-11-05 DIAGNOSIS — G4733 Obstructive sleep apnea (adult) (pediatric): Secondary | ICD-10-CM

## 2022-11-05 MED ORDER — FLUTICASONE PROPIONATE 50 MCG/ACT NA SUSP: 2.0000 | Freq: Every day | NASAL | 3 refills | Status: DC

## 2022-11-05 NOTE — Assessment & Plan Note (Signed)
BMI >42 with comorbid features Encourage weight loss diet lifestyle exercise and will pursue pharmacologic weight management

## 2022-11-05 NOTE — Assessment & Plan Note (Signed)
Well controlled, chronic OSA on CPAP - Good adherence to CPAP nightly - Continue current CPAP therapy, patient seems to be benefiting from therapy  

## 2022-11-05 NOTE — Assessment & Plan Note (Signed)
Currently stable without new concerns Not on medication

## 2022-11-05 NOTE — Assessment & Plan Note (Signed)
Prior A1c trend 5.7 to 5.4, due today for labs Concern with obesity HLD  Plan:  1. Not on any therapy currently  2. Encourage improved lifestyle - low carb, low sugar diet, reduce portion size, continue improving regular exercise  F/u A1c, consider treating weight

## 2022-11-05 NOTE — Progress Notes (Signed)
Subjective:    Patient ID: Beth [name], female    DOB: 1970-11-19, 52 y.o.   MRN: [id number]  Beth [name] is a 52 y.o. female presenting on 11/05/2022 for Annual Exam   HPI  Here for Annual Physical and Fasting lab orders today.  Insomnia Sleep maintenance problem May fall asleep fairly easily, will wake up every few hours it seems throughout the night No obvious symptoms waking her up Admits anxiety, things several things on the mind but unsure if this is the answer. Tried avoid screen time 1 hr before, white noise rain, tea, bath before. Tried OTC sleep med, melatonin, Zquill Admits peri menopausal Improved on Ambien CR 6.25mg    Morbid Obesity BMI >42 Pre-Diabetes Last A1c trend 5.7 > 5.4 (12/2021) Previously on Wegovy, now not available Weight gradual increase from 225 to 231 lbs Admits keeps improving diet and lifestyle exercise factors. Interested in revisiting med options  OSA, on CPAP - Patient reports prior history of dx OSA and on CPAP, prior to treatment initial symptoms were snoring, daytime sleepiness and fatigue - Today reports that sleep apnea is well controlled. She uses the CPAP machine every night. Tolerates the machine well, and thinks that sleeps better with it and feels good. No new concerns or symptoms.  Major Depression recurrent in partial remission No new concerns today   HyperTG Familial suspected Previously 02/2022 with LDL 368  Sinusitis History recent URI few months ago, now has residual sinus drainage congestion. Ear pressure sinus pressure   Health Maintenance:  Cologuard 04/10/22 - negative, repeat in 3 years, 03/2025  Last pap smear 09/04/21 - next due 3-5 years, approx 2028     11/05/2022    8:36 AM 06/30/2022    1:29 PM 02/26/2022    8:50 AM  Depression screen PHQ 2/9  Decreased Interest 2 1 1   Down, Depressed, Hopeless 1 0 1  PHQ - 2 Score 3 1 2   Altered sleeping 1 3 2   Tired, decreased energy 1 2 0  Change in appetite 3 2 2    Feeling bad or failure about yourself  0 1 2  Trouble concentrating 2 1 0  Moving slowly or fidgety/restless 0 0 0  Suicidal thoughts 0 0 0  PHQ-9 Score 10 10 8   Difficult doing work/chores Somewhat difficult Very difficult Very difficult    Past Medical History:  Diagnosis Date   Anemia    Anxiety    Depression    GERD (gastroesophageal reflux disease)    Kidney stone    kidney stone   Past Surgical History:  Procedure Laterality Date   HERNIA REPAIR  06/11/2021   Social History   Socioeconomic History   Marital status: Divorced    Spouse name: Not on file   Number of children: 4   Years of education: Not on file   Highest education level: Not on file  Occupational History   Not on file  Tobacco Use   Smoking status: Never   Smokeless tobacco: Never  Vaping Use   Vaping Use: Never used  Substance and Sexual Activity   Alcohol use: Yes    Alcohol/week: 2.0 standard drinks of alcohol    Types: 2 Standard drinks or equivalent per week    Comment: occasionally   Drug use: No   Sexual activity: Not Currently    Birth control/protection: None  Other Topics Concern   Not on file  Social History Narrative   Not on file   Social Determinants of  Health   Financial Resource Strain: Not on file  Food Insecurity: Not on file  Transportation Needs: Not on file  Physical Activity: Not on file  Stress: Not on file  Social Connections: Not on file  Intimate Partner Violence: Not on file   Family History  Problem Relation Age of Onset   Diabetes Mother    Hypertension Mother    Diabetes Father    Hypertension Father    Diabetes Maternal Grandmother    Heart disease Maternal Grandmother    Diabetes Maternal Grandfather    Diabetes Paternal Grandmother    Diabetes Paternal Grandfather    Current Outpatient Medications on File Prior to Visit  Medication Sig   famotidine (PEPCID) 20 MG tablet Take 1 tablet (20 mg total) by mouth 2 (two) times daily.    Norethindrone Acetate-Ethinyl Estrad-FE (MICROGESTIN 24 FE) 1-20 MG-MCG(24) tablet Take 1 tablet by mouth daily.   zolpidem (AMBIEN CR) 6.25 MG CR tablet Take 1 tablet (6.25 mg total) by mouth at bedtime as needed for sleep.   [DISCONTINUED] albuterol (PROVENTIL HFA;VENTOLIN HFA) 108 (90 Base) MCG/ACT inhaler Inhale 2 puffs into the lungs every 6 (six) hours as needed for wheezing or shortness of breath.   [DISCONTINUED] clonazePAM (KLONOPIN) 0.5 MG tablet Take 1 tablet by mouth as needed.   No current facility-administered medications on file prior to visit.    Review of Systems  Constitutional:  Negative for activity change, appetite change, chills, diaphoresis, fatigue and fever.  HENT:  Negative for congestion and hearing loss.   Eyes:  Negative for visual disturbance.  Respiratory:  Negative for cough, chest tightness, shortness of breath and wheezing.   Cardiovascular:  Negative for chest pain, palpitations and leg swelling.  Gastrointestinal:  Negative for abdominal pain, constipation, diarrhea, nausea and vomiting.  Genitourinary:  Negative for dysuria, frequency and hematuria.  Musculoskeletal:  Negative for arthralgias and neck pain.  Skin:  Negative for rash.  Neurological:  Negative for dizziness, weakness, light-headedness, numbness and headaches.  Hematological:  Negative for adenopathy.  Psychiatric/Behavioral:  Negative for behavioral problems, dysphoric mood and sleep disturbance.    Per HPI unless specifically indicated above      Objective:    BP 124/70   Pulse 96   Ht 5\' 2"  (1.575 m)   Wt 231 lb 12.8 oz (105.1 kg)   SpO2 99%   BMI 42.40 kg/m   Wt Readings from Last 3 Encounters:  11/05/22 231 lb 12.8 oz (105.1 kg)  07/07/22 230 lb (104.3 kg)  06/30/22 226 lb (102.5 kg)    Physical Exam Vitals and nursing note reviewed.  Constitutional:      General: She is not in acute distress.    Appearance: She is well-developed. She is not diaphoretic.      Comments: Well-appearing, comfortable, cooperative  HENT:     Head: Normocephalic and atraumatic.  Eyes:     General:        Right eye: No discharge.        Left eye: No discharge.     Conjunctiva/sclera: Conjunctivae normal.     Pupils: Pupils are equal, round, and reactive to light.  Neck:     Thyroid: No thyromegaly.     Vascular: No carotid bruit.  Cardiovascular:     Rate and Rhythm: Normal rate and regular rhythm.     Pulses: Normal pulses.     Heart sounds: Normal heart sounds. No murmur heard. Pulmonary:     Effort: Pulmonary effort  is normal. No respiratory distress.     Breath sounds: Normal breath sounds. No wheezing or rales.  Abdominal:     General: Bowel sounds are normal. There is no distension.     Palpations: Abdomen is soft. There is no mass.     Tenderness: There is no abdominal tenderness.  Musculoskeletal:        General: No tenderness. Normal range of motion.     Cervical back: Normal range of motion and neck supple.     Right lower leg: No edema.     Left lower leg: No edema.     Comments: Upper / Lower Extremities: - Normal muscle tone, strength bilateral upper extremities 5/5, lower extremities 5/5  Lymphadenopathy:     Cervical: No cervical adenopathy.  Skin:    General: Skin is warm and dry.     Findings: No erythema or rash.  Neurological:     Mental Status: She is alert and oriented to person, place, and time.     Comments: Distal sensation intact to light touch all extremities  Psychiatric:        Mood and Affect: Mood normal.        Behavior: Behavior normal.        Thought Content: Thought content normal.     Comments: Well groomed, good eye contact, normal speech and thoughts       Results for orders placed or performed in visit on 02/26/22  Cologuard  Result Value Ref Range   COLOGUARD Negative Negative      Assessment & Plan:   Problem List Items Addressed This Visit     Hyperlipidemia, unspecified   Relevant Orders    Lipid panel   TSH   Major depressive disorder, recurrent, in partial remission ([place])    Currently stable without new concerns Not on medication      Microcytic anemia    Previously low iron stores reserves ferritin Check anemia panel No longer on iron      Relevant Orders   CBC with Differential/Platelet   Iron, TIBC and Ferritin Panel   Morbid obesity ([place])    BMI >42 with comorbid features Encourage weight loss diet lifestyle exercise and will pursue pharmacologic weight management      Relevant Orders   COMPLETE METABOLIC PANEL WITH GFR   CBC with Differential/Platelet   Lipid panel   OSA on CPAP    Well controlled, chronic OSA on CPAP - Good adherence to CPAP nightly - Continue current CPAP therapy, patient seems to be benefiting from therapy       Pre-diabetes    Prior A1c trend 5.7 to 5.4, due today for labs Concern with obesity HLD  Plan:  1. Not on any therapy currently  2. Encourage improved lifestyle - low carb, low sugar diet, reduce portion size, continue improving regular exercise  F/u A1c, consider treating weight      Relevant Orders   Hemoglobin A1c   Other Visit Diagnoses     Annual physical exam    -  Primary   Relevant Orders   COMPLETE METABOLIC PANEL WITH GFR   CBC with Differential/Platelet   Lipid panel   Hemoglobin A1c   Primary insomnia       Vitamin D deficiency       Relevant Orders   VITAMIN D 25 Hydroxy (Vit-D Deficiency, Fractures)   Encounter for screening mammogram for malignant neoplasm of breast       Relevant Orders  MM 3D SCREENING MAMMOGRAM BILATERAL BREAST   Rhinosinusitis       Relevant Medications   fluticasone (FLONASE) 50 MCG/ACT nasal spray       Updated Health Maintenance information Fasting labs ordered today Reviewed recent lab results with patient Encouraged improvement to lifestyle with diet and exercise Goal of weight loss  Ordered Mammogram  #Morbid Obesity BMI >42 Weight  Management Previously on Wegovy, came off in 2023 when out of stock Rx sample Contrave 1 bottle today, 1 week supply trial today She did not pursue the order last time, has not started it yet. Consider Zepbound as next option if covered. She will follow up with her preference and let us know.  Orders Placed This Encounter  Procedures   MM 3D SCREENING MAMMOGRAM BILATERAL BREAST    Standing Status:   Future    Standing Expiration Date:   11/05/2023    Order Specific Question:   Reason for Exam (SYMPTOM  OR DIAGNOSIS REQUIRED)    Answer:   Screening bilateral 3D Mammogram [place]    Order Specific Question:   Preferred imaging location?    Answer:   [facility]   COMPLETE METABOLIC PANEL WITH GFR   CBC with Differential/Platelet   Lipid panel    Order Specific Question:   Has the patient fasted?    Answer:   Yes   Hemoglobin A1c   VITAMIN D 25 Hydroxy (Vit-D Deficiency, Fractures)   Iron, TIBC and Ferritin Panel   TSH     Meds ordered this encounter  Medications   fluticasone (FLONASE) 50 MCG/ACT nasal spray    Sig: Place 2 sprays into both nostrils daily. Use for 4-6 weeks then stop and use seasonally or as needed.    Dispense:  16 g    Refill:  3      Follow up plan: Return in about 3 months (around 02/05/2023) for 3 month A1c Weight Management.  Nobie Putnam, [facility] 11/05/2022, 8:13 AM

## 2022-11-05 NOTE — Assessment & Plan Note (Signed)
Previously low iron stores reserves ferritin Check anemia panel No longer on iron

## 2022-11-05 NOTE — Patient Instructions (Addendum)
Thank you for coming to the office today.  Next Cologuard 2026  Next Pap in 2028  Mammogram due anytime in 2024.  For Mammogram screening for breast cancer   Call the [facility] below anytime to schedule your own appointment now that order has been placed.  [place] [facility] [address], [address] [place], [place] [address] Phone: [phone]   ---------- Call insurance find cost and coverage of the following - check the following: - Drug Tier, Preferred List, On Formulary - All will require a "Prior Authorization" from [place] first, before you can find out the cost - Find out if there is "Step Therapy" (other medicines required before you can try these)  Once you pick the one you want to try, let me know - we can get a sample ready IF we have it in stock. Then try it - and before running out of medicine, contact me back to order your Rx so we have time to get it processed.  For Weight Loss / Obesity only  Wegovy (same as Ozempic) weekly injection - start 0.25mg  weekly, 1 dose per pen, single use, auto-injector  2. Saxenda - DAILY injection - start 0.6mg  injection DAILY, you can increase the dose by 1 notch or 0.6 mg per week, if you don't tolerate a dose, can reduce it the next day.  3. Contrave - oral medication, appetite suppression has wellbutrin/bupropion and naltrexone in it and it can also help with appetite, it is ordered through a speciality pharmacy. - $99  4. Zepbound (same as Mounjaro) - weekly injection 2.5 > 5 > 7.5 > 10 > and higher - The 2.5 and 5 should be available.  Future make sure your insurance has weight loss coverage  1 week sample Contrave today - and let me know if you prefer the order on Contrave $99 per month mail order pharmacy or if insurance covers the Zepbound with an approval from our office we can order that.  [email or web address]   Please  schedule a Follow-up Appointment to: Return in about 3 months (around 02/05/2023) for 3 month A1c Weight Management.  If you have any other questions or concerns, please feel free to call the office or send a message through [place]. You may also schedule an earlier appointment if necessary.  Additionally, you may be receiving a survey about your experience at our office within a few days to 1 week by e-mail or mail. We value your feedback.  Nobie Putnam, DO [place]

## 2022-11-06 ENCOUNTER — Encounter: Payer: Self-pay | Admitting: Family Medicine

## 2022-11-06 DIAGNOSIS — D509 Iron deficiency anemia, unspecified: Secondary | ICD-10-CM

## 2022-11-06 DIAGNOSIS — N921 Excessive and frequent menstruation with irregular cycle: Secondary | ICD-10-CM

## 2022-11-06 DIAGNOSIS — D649 Anemia, unspecified: Secondary | ICD-10-CM

## 2022-11-06 LAB — IRON,TIBC AND FERRITIN PANEL
%SAT: 5 % (calc) — ABNORMAL LOW (ref 16–45)
Ferritin: 9 ng/mL — ABNORMAL LOW (ref 16–232)
Iron: 26 ug/dL — ABNORMAL LOW (ref 40–190)
TIBC: 509 mcg/dL (calc) — ABNORMAL HIGH (ref 250–450)

## 2022-11-06 LAB — LIPID PANEL
Cholesterol: 168 mg/dL (ref <200)
HDL: 48 mg/dL — ABNORMAL LOW (ref >=50)
LDL Cholesterol (Calc): 88 mg/dL (calc)
Non-HDL Cholesterol (Calc): 120 mg/dL (calc) (ref <130)
Total CHOL/HDL Ratio: 3.5 (calc) (ref <5.0)
Triglycerides: 229 mg/dL — ABNORMAL HIGH (ref <150)

## 2022-11-06 LAB — HEMOGLOBIN A1C
Hgb A1c MFr Bld: 5.9 % of total Hgb — ABNORMAL HIGH (ref <5.7)
Mean Plasma Glucose: 123 mg/dL
eAG (mmol/L): 6.8 mmol/L

## 2022-11-06 LAB — COMPLETE METABOLIC PANEL WITH GFR
AG Ratio: 1.2 (calc) (ref 1.0–2.5)
ALT: 11 U/L (ref 6–29)
AST: 14 U/L (ref 10–35)
Albumin: 4.1 g/dL (ref 3.6–5.1)
Alkaline phosphatase (APISO): 86 U/L (ref 31–125)
BUN: 9 mg/dL (ref 7–25)
CO2: 24 mmol/L (ref 20–32)
Calcium: 9.5 mg/dL (ref 8.6–10.2)
Chloride: 105 mmol/L (ref 98–110)
Creat: 0.63 mg/dL (ref 0.50–0.99)
Globulin: 3.5 g/dL (calc) (ref 1.9–3.7)
Glucose, Bld: 104 mg/dL — ABNORMAL HIGH (ref 65–99)
Potassium: 4.5 mmol/L (ref 3.5–5.3)
Sodium: 139 mmol/L (ref 135–146)
Total Bilirubin: 0.3 mg/dL (ref 0.2–1.2)
Total Protein: 7.6 g/dL (ref 6.1–8.1)
eGFR: 109 mL/min/{1.73_m2} (ref >=60)

## 2022-11-06 LAB — CBC WITH DIFFERENTIAL/PLATELET
Absolute Monocytes: 1155 cells/uL — ABNORMAL HIGH (ref 200–950)
Basophils Absolute: 54 cells/uL (ref 0–200)
Basophils Relative: 0.7 %
Eosinophils Absolute: 92 cells/uL (ref 15–500)
Eosinophils Relative: 1.2 %
HCT: 37.7 % (ref 35.0–45.0)
Hemoglobin: 11.7 g/dL (ref 11.7–15.5)
Lymphs Abs: 2087 cells/uL (ref 850–3900)
MCH: 22.5 pg — ABNORMAL LOW (ref 27.0–33.0)
MCHC: 31 g/dL — ABNORMAL LOW (ref 32.0–36.0)
MCV: 72.5 fL — ABNORMAL LOW (ref 80.0–100.0)
MPV: 8.9 fL (ref 7.5–12.5)
Monocytes Relative: 15 %
Neutro Abs: 4312 cells/uL (ref 1500–7800)
Neutrophils Relative %: 56 %
Platelets: 337 10*3/uL (ref 140–400)
RBC: 5.2 10*6/uL — ABNORMAL HIGH (ref 3.80–5.10)
RDW: 16.7 % — ABNORMAL HIGH (ref 11.0–15.0)
Total Lymphocyte: 27.1 %
WBC: 7.7 10*3/uL (ref 3.8–10.8)

## 2022-11-06 LAB — VITAMIN D 25 HYDROXY (VIT D DEFICIENCY, FRACTURES): Vit D, 25-Hydroxy: 13 ng/mL — ABNORMAL LOW (ref 30–100)

## 2022-11-06 LAB — TSH: TSH: 1.81 mIU/L

## 2022-11-07 NOTE — Addendum Note (Signed)
Addended by: Olin Hauser on: 11/07/2022 05:50 PM   Modules accepted: Orders

## 2022-11-18 ENCOUNTER — Inpatient Hospital Stay: Payer: BC Managed Care – PPO | Attending: Internal Medicine | Admitting: Internal Medicine

## 2022-11-18 ENCOUNTER — Encounter: Payer: Self-pay | Admitting: Internal Medicine

## 2022-11-18 ENCOUNTER — Inpatient Hospital Stay: Payer: BC Managed Care – PPO

## 2022-11-18 VITALS — BP 119/67 | HR 93 | Temp 96.8°F | Resp 16 | Wt 231.0 lb

## 2022-11-18 DIAGNOSIS — Z809 Family history of malignant neoplasm, unspecified: Secondary | ICD-10-CM | POA: Diagnosis not present

## 2022-11-18 DIAGNOSIS — E611 Iron deficiency: Secondary | ICD-10-CM

## 2022-11-18 DIAGNOSIS — K5900 Constipation, unspecified: Secondary | ICD-10-CM | POA: Diagnosis not present

## 2022-11-18 DIAGNOSIS — R5383 Other fatigue: Secondary | ICD-10-CM | POA: Diagnosis not present

## 2022-11-18 DIAGNOSIS — D649 Anemia, unspecified: Secondary | ICD-10-CM | POA: Diagnosis not present

## 2022-11-18 DIAGNOSIS — R197 Diarrhea, unspecified: Secondary | ICD-10-CM | POA: Insufficient documentation

## 2022-11-18 DIAGNOSIS — E559 Vitamin D deficiency, unspecified: Secondary | ICD-10-CM

## 2022-11-18 DIAGNOSIS — N920 Excessive and frequent menstruation with regular cycle: Secondary | ICD-10-CM

## 2022-11-18 NOTE — Progress Notes (Signed)
This [facility] [place] NOTE  Patient Care Team: Olin Hauser, DO as PCP - General (Family Medicine)  CHIEF COMPLAINTS/PURPOSE OF CONSULTATION: ANEMIA   HEMATOLOGY HISTORY  # ANEMIA[Hb; MCV-platelets- WBC; Iron sat; ferritin;  GFR- CT/US- ;  EGD-none/colonoscopy- none [cologard- neg as per pt]   Latest Reference Range & Units 11/05/22 08:46  Iron 40 - 190 mcg/dL 26 (L)  TIBC 250 - 450 mcg/dL (calc) 509 (H)  %SAT 16 - 45 % (calc) 5 (L)  Ferritin 16 - 232 ng/mL 9 (L)  Alkaline phosphatase (APISO) 31 - 125 U/L 86  Vitamin D, 25-Hydroxy 30 - 100 ng/mL 13 (L)  (L): Data is abnormally low (H): Data is abnormally high  HISTORY OF PRESENTING ILLNESS: Patient ambulating-independently. Alone.   Beth [name] 52 y.o.  female pleasant patient was been referred to [place] for further evaluation of anemia.  Patient admits to ongoing fatigue.  Denies any nausea vomiting or weight loss.  Poor tolerance to oral iron/constipation.  Blood in stools: none Blood in urine:none Difficulty swallowing:none Change of bowel movement/constipation alternating with diarrhea. No GI evaluation.  Prior blood transfusion:none Prior history of blood loss: none Liver disease:none Alcohol: once or twice a month.  Bariatric surgery: none  Vaginal bleeding: heavy [3-4 days; change pads every 2-3 hours]  Prior evaluation with hematology:none Prior bone marrow biopsy: none Oral iron: none; in past caused constipation.  Prior IV iron infusions: none   Review of Systems  Constitutional:  Positive for malaise/fatigue. Negative for chills, diaphoresis, fever and weight loss.  HENT:  Negative for nosebleeds and sore throat.   Eyes:  Negative for double vision.  Respiratory:  Negative for cough, hemoptysis, sputum production, shortness of breath and wheezing.   Cardiovascular:  Negative for chest pain, palpitations, orthopnea and leg swelling.  Gastrointestinal:  Negative for abdominal  pain, blood in stool, constipation, diarrhea, heartburn, melena, nausea and vomiting.  Genitourinary:  Negative for dysuria, frequency and urgency.  Musculoskeletal:  Negative for back pain and joint pain.  Skin: Negative.  Negative for itching and rash.  Neurological:  Negative for dizziness, tingling, focal weakness, weakness and headaches.  Endo/Heme/Allergies:  Does not bruise/bleed easily.  Psychiatric/Behavioral:  Negative for depression. The patient is not nervous/anxious and does not have insomnia.    MEDICAL HISTORY:  Past Medical History:  Diagnosis Date   Anemia    Anxiety    Depression    GERD (gastroesophageal reflux disease)    Kidney stone    kidney stone    SURGICAL HISTORY: Past Surgical History:  Procedure Laterality Date   HERNIA REPAIR  06/11/2021   TUBAL LIGATION      SOCIAL HISTORY: Social History   Socioeconomic History   Marital status: Divorced    Spouse name: Not on file   Number of children: 4   Years of education: Not on file   Highest education level: Not on file  Occupational History   Not on file  Tobacco Use   Smoking status: Never   Smokeless tobacco: Never  Vaping Use   Vaping Use: Never used  Substance and Sexual Activity   Alcohol use: Yes    Alcohol/week: 2.0 standard drinks of alcohol    Types: 2 Standard drinks or equivalent per week    Comment: occasionally   Drug use: No   Sexual activity: Yes    Birth control/protection: None  Other Topics Concern   Not on file  Social History Narrative   Lives in  [place]; via health care. With son/daughter. No smoking; rare alcohol.    Social Determinants of Health   Financial Resource Strain: Not on file  Food Insecurity: No Food Insecurity (11/18/2022)   Hunger Vital Sign    Worried About Running Out of Food in the Last Year: Never true    Ran Out of Food in the Last Year: Never true  Transportation Needs: No Transportation Needs (11/18/2022)   PRAPARE - Civil engineer, contracting (Medical): No    Lack of Transportation (Non-Medical): No  Physical Activity: Not on file  Stress: Not on file  Social Connections: Not on file  Intimate Partner Violence: Not At Risk (11/18/2022)   Humiliation, Afraid, Rape, and Kick questionnaire    Fear of Current or Ex-Partner: No    Emotionally Abused: No    Physically Abused: No    Sexually Abused: No    FAMILY HISTORY: Family History  Problem Relation Age of Onset   Diabetes Mother    Hypertension Mother    Diabetes Father    Hypertension Father    Cancer Paternal Uncle    Diabetes Maternal Grandmother    Heart disease Maternal Grandmother    Diabetes Maternal Grandfather    Diabetes Paternal Grandmother    Diabetes Paternal Grandfather     ALLERGIES:  has No Known Allergies.  MEDICATIONS:  Current Outpatient Medications  Medication Sig Dispense Refill   famotidine (PEPCID) 20 MG tablet Take 1 tablet (20 mg total) by mouth 2 (two) times daily. (Patient taking differently: Take 20 mg by mouth 2 (two) times daily. Pt reports taking PRN) 60 tablet 0   zolpidem (AMBIEN CR) 6.25 MG CR tablet Take 1 tablet (6.25 mg total) by mouth at bedtime as needed for sleep. 30 tablet 5   No current facility-administered medications for this visit.     PHYSICAL EXAMINATION:   Vitals:   11/18/22 1156  BP: 119/67  Pulse: 93  Resp: 16  Temp: (!) 96.8 F (36 C)  SpO2: 97%   Filed Weights   11/18/22 1156  Weight: 231 lb (104.8 kg)    Physical Exam Vitals and nursing note reviewed.  HENT:     Head: Normocephalic and atraumatic.     Mouth/Throat:     Pharynx: Oropharynx is clear.  Eyes:     Extraocular Movements: Extraocular movements intact.     Pupils: Pupils are equal, round, and reactive to light.  Cardiovascular:     Rate and Rhythm: Normal rate and regular rhythm.  Pulmonary:     Comments: Decreased breath sounds bilaterally.  Abdominal:     Palpations: Abdomen is soft.  Musculoskeletal:         General: Normal range of motion.     Cervical back: Normal range of motion.  Skin:    General: Skin is warm.  Neurological:     General: No focal deficit present.     Mental Status: She is alert and oriented to person, place, and time.  Psychiatric:        Behavior: Behavior normal.        Judgment: Judgment normal.      LABORATORY DATA:  I have reviewed the data as listed Lab Results  Component Value Date   WBC 7.7 11/05/2022   HGB 11.7 11/05/2022   HCT 37.7 11/05/2022   MCV 72.5 (L) 11/05/2022   PLT 337 11/05/2022   Recent Labs    11/05/22 0846  NA 139  K 4.5  CL 105  CO2 24  GLUCOSE 104*  BUN 9  CREATININE 0.63  CALCIUM 9.5  PROT 7.6  AST 14  ALT 11  BILITOT 0.3     No results found.  ASSESSMENT & PLAN:   Symptomatic anemia # Anemia- Hb-11.7- Likely due to iron deficiency - from etiology GI blood loss/menorrhagia [see below].  Iron studies March 2024-severe iron deficiency.   # Given poor tolerance-constipation recommend IV iron infusion.  Discussed regarding IV iron infusion/Venofer. Discussed the potential acute infusion reactions with IV iron; which are quite rare.  Patient understands the risk; will proceed with infusions.  Patient had bilateral tubal ligation; hold off given pregnancy test.  # Etiology of iron deficiency: Likely secondary to menorrhagia.  However, patient has not had screening colonoscopy.  Discussed re: referral to GI patient.  However patient declines, as she had a recent Cologuard negative.   # Menorrahagia- s/p gyn evaluation [Dr.Copeland].   # Vit D def -[march 2024- 20s]recommend vitamin D supplementation as per PCP.  Thank you Dr.Karamalegos for allowing me to participate in the care of your pleasant patient. Please do not hesitate to contact me with questions or concerns in the interim.  # DISPOSITION: # no labs today- # venofer weekly x 3 # follow up in 2 months-labs- cbc;bmp; LDH; possible venofer-Dr.B  All  questions were answered. The patient knows to call the clinic with any problems, questions or concerns.   Cammie Sickle, MD 11/18/2022 12:40 PM

## 2022-11-18 NOTE — Progress Notes (Signed)
New patient referred for iron deficiency anemia by Dr Parks Ranger. Pt reports being very tired all the time.

## 2022-11-18 NOTE — Assessment & Plan Note (Addendum)
#   Anemia- Hb-11.7- Likely due to iron deficiency - from etiology GI blood loss/menorrhagia [see below].  Iron studies March 2024-severe iron deficiency.   # Given poor tolerance-constipation recommend IV iron infusion.  Discussed regarding IV iron infusion/Venofer. Discussed the potential acute infusion reactions with IV iron; which are quite rare.  Patient understands the risk; will proceed with infusions.  Patient had bilateral tubal ligation; hold off given pregnancy test.  # Etiology of iron deficiency: Likely secondary to menorrhagia.  However, patient has not had screening colonoscopy.  Discussed re: referral to GI patient.  However patient declines, as she had a recent Cologuard negative.   # Menorrahagia- s/p gyn evaluation [Dr.Copeland].   # Vit D def -[march 2024- 20s]recommend vitamin D supplementation as per PCP.  Thank you Dr.Karamalegos for allowing me to participate in the care of your pleasant patient. Please do not hesitate to contact me with questions or concerns in the interim.  # DISPOSITION: # no labs today- # venofer weekly x 3 # follow up in 2 months-labs- cbc;bmp; LDH; possible venofer-Dr.B

## 2022-11-24 ENCOUNTER — Inpatient Hospital Stay: Payer: BC Managed Care – PPO

## 2022-11-24 VITALS — BP 121/79 | HR 88 | Temp 98.0°F | Resp 17

## 2022-11-24 DIAGNOSIS — D649 Anemia, unspecified: Secondary | ICD-10-CM

## 2022-11-24 DIAGNOSIS — K5900 Constipation, unspecified: Secondary | ICD-10-CM | POA: Diagnosis not present

## 2022-11-24 DIAGNOSIS — Z809 Family history of malignant neoplasm, unspecified: Secondary | ICD-10-CM | POA: Diagnosis not present

## 2022-11-24 DIAGNOSIS — R5383 Other fatigue: Secondary | ICD-10-CM | POA: Diagnosis not present

## 2022-11-24 DIAGNOSIS — N920 Excessive and frequent menstruation with regular cycle: Secondary | ICD-10-CM | POA: Diagnosis not present

## 2022-11-24 DIAGNOSIS — E559 Vitamin D deficiency, unspecified: Secondary | ICD-10-CM | POA: Diagnosis not present

## 2022-11-24 DIAGNOSIS — R197 Diarrhea, unspecified: Secondary | ICD-10-CM | POA: Diagnosis not present

## 2022-11-24 DIAGNOSIS — E611 Iron deficiency: Secondary | ICD-10-CM | POA: Diagnosis not present

## 2022-11-24 MED ORDER — SODIUM CHLORIDE 0.9 % IV SOLN
200.0000 mg | Freq: Once | INTRAVENOUS | Status: AC
  Administered 2022-11-24: 200 mg via INTRAVENOUS
  Filled 2022-11-24: qty 200

## 2022-11-24 MED ORDER — SODIUM CHLORIDE 0.9% FLUSH
10.0000 mL | Freq: Once | INTRAVENOUS | Status: AC | PRN
  Administered 2022-11-24: 10 mL
  Filled 2022-11-24: qty 10

## 2022-11-24 MED ORDER — SODIUM CHLORIDE 0.9 % IV SOLN
Freq: Once | INTRAVENOUS | Status: AC
  Filled 2022-11-24: qty 250

## 2022-11-24 NOTE — Progress Notes (Signed)
Patient tolerated first Venofer infusion well, no questions/concerns voiced. Monitored 30 min post transfusion. Patient stable at discharge. VSS. AVS given.

## 2022-11-24 NOTE — Patient Instructions (Signed)

## 2022-11-25 ENCOUNTER — Encounter: Payer: Self-pay | Admitting: Licensed Clinical Social Worker

## 2022-11-25 NOTE — Progress Notes (Signed)
[facility] Clinical Social Work  Clinical Social Work was referred by medical provider for assessment of psychosocial needs.  Clinical Social Worker attempted to contact patient by phone  to offer support and assess for needs.  CSW left voicemail with contact information and request for return call.   FA   Toneka Fullen, LCSW  Clinical Social Worker [facility]          

## 2022-12-01 ENCOUNTER — Inpatient Hospital Stay: Payer: BC Managed Care – PPO

## 2022-12-01 VITALS — BP 121/75 | HR 101 | Temp 98.7°F

## 2022-12-01 DIAGNOSIS — D649 Anemia, unspecified: Secondary | ICD-10-CM

## 2022-12-01 DIAGNOSIS — E559 Vitamin D deficiency, unspecified: Secondary | ICD-10-CM | POA: Diagnosis not present

## 2022-12-01 DIAGNOSIS — Z809 Family history of malignant neoplasm, unspecified: Secondary | ICD-10-CM | POA: Diagnosis not present

## 2022-12-01 DIAGNOSIS — R5383 Other fatigue: Secondary | ICD-10-CM | POA: Diagnosis not present

## 2022-12-01 DIAGNOSIS — R197 Diarrhea, unspecified: Secondary | ICD-10-CM | POA: Diagnosis not present

## 2022-12-01 DIAGNOSIS — N920 Excessive and frequent menstruation with regular cycle: Secondary | ICD-10-CM | POA: Diagnosis not present

## 2022-12-01 DIAGNOSIS — E611 Iron deficiency: Secondary | ICD-10-CM | POA: Diagnosis not present

## 2022-12-01 DIAGNOSIS — K5900 Constipation, unspecified: Secondary | ICD-10-CM | POA: Diagnosis not present

## 2022-12-01 MED ORDER — SODIUM CHLORIDE 0.9 % IV SOLN
200.0000 mg | Freq: Once | INTRAVENOUS | Status: AC
  Administered 2022-12-01: 200 mg via INTRAVENOUS
  Filled 2022-12-01: qty 200

## 2022-12-01 MED ORDER — SODIUM CHLORIDE 0.9 % IV SOLN
Freq: Once | INTRAVENOUS | Status: AC
  Filled 2022-12-01: qty 250

## 2022-12-01 NOTE — Progress Notes (Signed)
Pt declined 30 minute post observation. Educated on risks. Pt verbalized understanding.

## 2022-12-02 ENCOUNTER — Inpatient Hospital Stay: Payer: BC Managed Care – PPO | Admitting: Licensed Clinical Social Worker

## 2022-12-02 DIAGNOSIS — D649 Anemia, unspecified: Secondary | ICD-10-CM

## 2022-12-02 NOTE — Progress Notes (Signed)
[facility] Clinical Social Work  Initial Assessment   Beth [name] is a 52 y.o. year old female contacted by phone. Clinical Social Work was referred by medical provider for assessment of psychosocial needs.   SDOH (Social Determinants of Health) assessments performed: Yes SDOH Interventions    Flowsheet Row Clinical Support from 12/02/2022 in [facility] Visit from 11/05/2022 in [facility] Office Visit from 06/30/2022 in [facility] Office Visit from 08/15/2021 in [facility] Office Visit from 01/20/2019 in [facility] at [facility]  SDOH Interventions       Depression Interventions/Treatment  -- Currently on Treatment, Counseling Medication, Counseling Counseling Referral to Psychiatry  Financial Strain Interventions Intervention Not Indicated -- -- -- --  Physical Activity Interventions Intervention Not Indicated -- -- -- --  Stress Interventions Intervention Not Indicated -- -- -- --  Social Connections Interventions Intervention Not Indicated -- -- -- --       SDOH Screenings   Food Insecurity: No Food Insecurity (11/18/2022)  Housing: Low Risk  (11/18/2022)  Transportation Needs: No Transportation Needs (11/18/2022)  Utilities: At Risk (11/18/2022)  Alcohol Screen: Low Risk  (02/26/2022)  Depression (PHQ2-9): Low Risk  (11/18/2022)  Recent Concern: Depression (PHQ2-9) - Medium Risk (11/05/2022)  Financial Resource Strain: Medium Risk (12/02/2022)  Physical Activity: Insufficiently Active (12/02/2022)  Social Connections: Moderately Integrated (12/02/2022)  Stress: Stress Concern Present (12/02/2022)  Tobacco Use: Low Risk  (11/18/2022)     Distress Screen completed: No    11/18/2022   11:50 AM  ONCBCN DISTRESS SCREENING  Screening Type Initial Screening  Distress experienced in past week (1-10) 0      Family/Social Information:   Housing Arrangement: patient lives with mother,  Clancy Gourd [phone]  Family members/support persons in your life? Family, Friends, Acupuncturist, and Ambulance person concerns: no  Employment: Working full time [employer].  Income source: Employment Financial concerns:  general financial concerns Type of concern: no immediate concerns present Food access concerns: no Religious or spiritual practice: Yes-Christian Services Currently in place:  BCBS COMM PPO   Coping/ Adjustment to diagnosis: Patient understands treatment plan and what happens next? yes Concerns about diagnosis and/or treatment: I'm not especially worried about anything Patient reported stressors: Therapist, art and/or priorities: N/A Patient enjoys  N/A Current coping skills/ strengths: Active sense of humor , Average or above average intelligence , Capable of independent living , Manufacturing systems engineer , Contractor , General fund of knowledge , Motivation for treatment/growth , Physical Health , Religious Affiliation , Supportive family/friends , and Work skills     SUMMARY: Current SDOH Barriers:  No immediate SDOH barriers present  Clinical Social Work Clinical Goal(s):  No clinical social work goals at this time  Interventions: Discussed common feeling and emotions when being diagnosed with cancer, and the importance of support during treatment Informed patient of the support team roles and support services at [facility] Provided CSW contact information and encouraged patient to call with any questions or concerns Provided patient with information about CSW role in patient care and other available resources.   Follow Up Plan: Patient will contact CSW with any support or resource needs Patient verbalizes understanding of plan: Yes    Kirsten Spearing, LCSW

## 2022-12-08 ENCOUNTER — Inpatient Hospital Stay: Payer: BC Managed Care – PPO

## 2022-12-08 VITALS — BP 117/66 | HR 92 | Temp 98.0°F

## 2022-12-08 DIAGNOSIS — D649 Anemia, unspecified: Secondary | ICD-10-CM

## 2022-12-08 DIAGNOSIS — R5383 Other fatigue: Secondary | ICD-10-CM | POA: Diagnosis not present

## 2022-12-08 DIAGNOSIS — K5900 Constipation, unspecified: Secondary | ICD-10-CM | POA: Diagnosis not present

## 2022-12-08 DIAGNOSIS — Z809 Family history of malignant neoplasm, unspecified: Secondary | ICD-10-CM | POA: Diagnosis not present

## 2022-12-08 DIAGNOSIS — E611 Iron deficiency: Secondary | ICD-10-CM | POA: Diagnosis not present

## 2022-12-08 DIAGNOSIS — R197 Diarrhea, unspecified: Secondary | ICD-10-CM | POA: Diagnosis not present

## 2022-12-08 DIAGNOSIS — N920 Excessive and frequent menstruation with regular cycle: Secondary | ICD-10-CM | POA: Diagnosis not present

## 2022-12-08 DIAGNOSIS — E559 Vitamin D deficiency, unspecified: Secondary | ICD-10-CM | POA: Diagnosis not present

## 2022-12-08 MED ORDER — SODIUM CHLORIDE 0.9 % IV SOLN
200.0000 mg | Freq: Once | INTRAVENOUS | Status: AC
  Administered 2022-12-08: 200 mg via INTRAVENOUS
  Filled 2022-12-08: qty 200

## 2022-12-08 MED ORDER — SODIUM CHLORIDE 0.9 % IV SOLN
Freq: Once | INTRAVENOUS | Status: AC
  Filled 2022-12-08: qty 250

## 2022-12-08 NOTE — Patient Instructions (Signed)

## 2023-01-16 ENCOUNTER — Inpatient Hospital Stay: Payer: BC Managed Care – PPO | Attending: Internal Medicine

## 2023-01-16 DIAGNOSIS — D649 Anemia, unspecified: Secondary | ICD-10-CM | POA: Insufficient documentation

## 2023-01-16 LAB — CBC WITH DIFFERENTIAL (CANCER CENTER ONLY)
Abs Immature Granulocytes: 0.09 10*3/uL — ABNORMAL HIGH (ref 0.00–0.07)
Basophils Absolute: 0.1 10*3/uL (ref 0.0–0.1)
Basophils Relative: 1 %
Eosinophils Absolute: 0.1 10*3/uL (ref 0.0–0.5)
Eosinophils Relative: 1 %
HCT: 41.5 % (ref 36.0–46.0)
Hemoglobin: 13.3 g/dL (ref 12.0–15.0)
Immature Granulocytes: 1 %
Lymphocytes Relative: 28 %
Lymphs Abs: 3.2 10*3/uL (ref 0.7–4.0)
MCH: 24.5 pg — ABNORMAL LOW (ref 26.0–34.0)
MCHC: 32 g/dL (ref 30.0–36.0)
MCV: 76.6 fL — ABNORMAL LOW (ref 80.0–100.0)
Monocytes Absolute: 1 10*3/uL (ref 0.1–1.0)
Monocytes Relative: 8 %
Neutro Abs: 7.1 10*3/uL (ref 1.7–7.7)
Neutrophils Relative %: 61 %
Platelet Count: 304 10*3/uL (ref 150–400)
RBC: 5.42 MIL/uL — ABNORMAL HIGH (ref 3.87–5.11)
RDW: 19.7 % — ABNORMAL HIGH (ref 11.5–15.5)
WBC Count: 11.6 10*3/uL — ABNORMAL HIGH (ref 4.0–10.5)
nRBC: 0 % (ref 0.0–0.2)

## 2023-01-16 LAB — BASIC METABOLIC PANEL - CANCER CENTER ONLY
Anion gap: 10 (ref 5–15)
BUN: 14 mg/dL (ref 6–20)
CO2: 22 mmol/L (ref 22–32)
Calcium: 9.3 mg/dL (ref 8.9–10.3)
Chloride: 105 mmol/L (ref 98–111)
Creatinine: 0.69 mg/dL (ref 0.44–1.00)
GFR, Estimated: >60 mL/min (ref >60)
Glucose, Bld: 111 mg/dL — ABNORMAL HIGH (ref 70–99)
Potassium: 4 mmol/L (ref 3.5–5.1)
Sodium: 137 mmol/L (ref 135–145)

## 2023-01-16 LAB — LACTATE DEHYDROGENASE: LDH: 105 U/L (ref 98–192)

## 2023-01-19 ENCOUNTER — Inpatient Hospital Stay: Payer: BC Managed Care – PPO

## 2023-01-19 ENCOUNTER — Encounter: Payer: Self-pay | Admitting: Internal Medicine

## 2023-01-19 ENCOUNTER — Inpatient Hospital Stay: Payer: BC Managed Care – PPO | Attending: Internal Medicine | Admitting: Internal Medicine

## 2023-01-19 VITALS — BP 122/69 | HR 82 | Temp 98.4°F

## 2023-01-19 VITALS — BP 123/75 | HR 99 | Temp 99.4°F | Ht 62.0 in | Wt 236.0 lb

## 2023-01-19 DIAGNOSIS — D649 Anemia, unspecified: Secondary | ICD-10-CM

## 2023-01-19 DIAGNOSIS — D500 Iron deficiency anemia secondary to blood loss (chronic): Secondary | ICD-10-CM | POA: Diagnosis not present

## 2023-01-19 DIAGNOSIS — N920 Excessive and frequent menstruation with regular cycle: Secondary | ICD-10-CM | POA: Insufficient documentation

## 2023-01-19 DIAGNOSIS — E559 Vitamin D deficiency, unspecified: Secondary | ICD-10-CM | POA: Insufficient documentation

## 2023-01-19 MED ORDER — SODIUM CHLORIDE 0.9 % IV SOLN
200.0000 mg | Freq: Once | INTRAVENOUS | Status: AC
  Administered 2023-01-19: 200 mg via INTRAVENOUS
  Filled 2023-01-19: qty 200

## 2023-01-19 MED ORDER — SODIUM CHLORIDE 0.9 % IV SOLN
Freq: Once | INTRAVENOUS | Status: AC
  Filled 2023-01-19: qty 250

## 2023-01-19 NOTE — Assessment & Plan Note (Addendum)
#   Anemia- Hb-11.7- Likely due to iron deficiency - from etiology GI blood loss/menorrhagia [see below].  Iron studies March 2024-severe iron deficiency. S/p IV venofer x3- no reactions.   # Today Hb 13.4; proceed with venofer today.  Poor tolerance of oral iron/constipation.  # Etiology of iron deficiency: Likely secondary to menorrhagia.  However, patient has not had screening colonoscopy.  Discussed re: referral to GI patient.  However patient declines, as she had a recent Cologuard negative.   # Menorrahagia- s/p gyn evaluation [Dr.Copeland].   # Vit D def -[march 2024- 20s]-noncompliant.  Again recommend vitamin D supplementation as per PCP.  # DISPOSITION: # venofer today # follow up in 4 months MD; -labs- cbc;bmp; iron studies; ferritin- LDH; vit D 25- levels- possible venofer;-Dr.B

## 2023-01-19 NOTE — Progress Notes (Signed)
Fatigue/weakness: no Dyspena: no Light headedness: yes Blood in stool: no

## 2023-01-19 NOTE — Progress Notes (Signed)
This [facility] CONSULT NOTE  Patient Care Team: Smitty Cords, DO as PCP - General (Family Medicine) Earna Coder, MD as Consulting Physician (Internal Medicine)  CHIEF COMPLAINTS/PURPOSE OF CONSULTATION: ANEMIA   HEMATOLOGY HISTORY  # ANEMIA[Hb; MCV-platelets- WBC; Iron sat; ferritin;  GFR- CT/US- ;  EGD-none/colonoscopy- none [cologard- neg as per pt]   Latest Reference Range & Units 11/05/22 08:46  Iron 40 - 190 mcg/dL 26 (L)  TIBC 161 - 096 mcg/dL (calc) 045 (H)  %SAT 16 - 45 % (calc) 5 (L)  Ferritin 16 - 232 ng/mL 9 (L)  Alkaline phosphatase (APISO) 31 - 125 U/L 86  Vitamin D, 25-Hydroxy 30 - 100 ng/mL 13 (L)  (L): Data is abnormally low (H): Data is abnormally high  HISTORY OF PRESENTING ILLNESS: Patient ambulating-independently. Alone.   Beth [name] 52 y.o.  female pleasant patien iron deficiency anemia secondary positive menorrhagia is here for follow-up.  Patient s/p IV iron infusion-Notes improvement of energy levels.  Any shortness of breath or cough.   Denies having any reactions to IV iron infusions.   Review of Systems  Constitutional:  Positive for malaise/fatigue. Negative for chills, diaphoresis, fever and weight loss.  HENT:  Negative for nosebleeds and sore throat.   Eyes:  Negative for double vision.  Respiratory:  Negative for cough, hemoptysis, sputum production, shortness of breath and wheezing.   Cardiovascular:  Negative for chest pain, palpitations, orthopnea and leg swelling.  Gastrointestinal:  Negative for abdominal pain, blood in stool, constipation, diarrhea, heartburn, melena, nausea and vomiting.  Genitourinary:  Negative for dysuria, frequency and urgency.  Musculoskeletal:  Negative for back pain and joint pain.  Skin: Negative.  Negative for itching and rash.  Neurological:  Negative for dizziness, tingling, focal weakness, weakness and headaches.  Endo/Heme/Allergies:  Does not bruise/bleed easily.   Psychiatric/Behavioral:  Negative for depression. The patient is not nervous/anxious and does not have insomnia.    MEDICAL HISTORY:  Past Medical History:  Diagnosis Date   Anemia    Anxiety    Depression    GERD (gastroesophageal reflux disease)    Kidney stone    kidney stone    SURGICAL HISTORY: Past Surgical History:  Procedure Laterality Date   HERNIA REPAIR  06/11/2021   TUBAL LIGATION      SOCIAL HISTORY: Social History   Socioeconomic History   Marital status: Divorced    Spouse name: Not on file   Number of children: 4   Years of education: Not on file   Highest education level: Not on file  Occupational History   Not on file  Tobacco Use   Smoking status: Never   Smokeless tobacco: Never  Vaping Use   Vaping Use: Never used  Substance and Sexual Activity   Alcohol use: Yes    Alcohol/week: 2.0 standard drinks of alcohol    Types: 2 Standard drinks or equivalent per week    Comment: occasionally   Drug use: No   Sexual activity: Yes    Birth control/protection: None  Other Topics Concern   Not on file  Social History Narrative   Lives in [place]; via health care. With son/daughter. No smoking; rare alcohol.    Social Determinants of Health   Financial Resource Strain: Medium Risk (12/02/2022)   Overall Financial Resource Strain (CARDIA)    Difficulty of Paying Living Expenses: Somewhat hard  Food Insecurity: No Food Insecurity (11/18/2022)   Hunger Vital Sign    Worried About Running  Out of Food in the Last Year: Never true    Ran Out of Food in the Last Year: Never true  Transportation Needs: No Transportation Needs (11/18/2022)   PRAPARE - Administrator, Civil Service (Medical): No    Lack of Transportation (Non-Medical): No  Physical Activity: Insufficiently Active (12/02/2022)   Exercise Vital Sign    Days of Exercise per Week: 3 days    Minutes of Exercise per Session: 30 min  Stress: Stress Concern Present (12/02/2022)    [employer] of Occupational Health - Occupational Stress Questionnaire    Feeling of Stress : To some extent  Social Connections: Moderately Integrated (12/02/2022)   Social Connection and Isolation Panel [NHANES]    Frequency of Communication with Friends and Family: Three times a week    Frequency of Social Gatherings with Friends and Family: Three times a week    Attends Religious Services: 1 to 4 times per year    Active Member of Clubs or Organizations: Yes    Attends Banker Meetings: 1 to 4 times per year    Marital Status: Divorced  Intimate Partner Violence: Not At Risk (11/18/2022)   Humiliation, Afraid, Rape, and Kick questionnaire    Fear of Current or Ex-Partner: No    Emotionally Abused: No    Physically Abused: No    Sexually Abused: No    FAMILY HISTORY: Family History  Problem Relation Age of Onset   Diabetes Mother    Hypertension Mother    Diabetes Father    Hypertension Father    Cancer Paternal Uncle    Diabetes Maternal Grandmother    Heart disease Maternal Grandmother    Diabetes Maternal Grandfather    Diabetes Paternal Grandmother    Diabetes Paternal Grandfather     ALLERGIES:  has No Known Allergies.  MEDICATIONS:  Current Outpatient Medications  Medication Sig Dispense Refill   famotidine (PEPCID) 20 MG tablet Take 1 tablet (20 mg total) by mouth 2 (two) times daily. (Patient taking differently: Take 20 mg by mouth 2 (two) times daily. Pt reports taking PRN) 60 tablet 0   zolpidem (AMBIEN CR) 6.25 MG CR tablet Take 1 tablet (6.25 mg total) by mouth at bedtime as needed for sleep. 30 tablet 5   No current facility-administered medications for this visit.     PHYSICAL EXAMINATION:   Vitals:   01/19/23 1502  BP: 123/75  Pulse: 99  Temp: 99.4 F (37.4 C)  SpO2: 98%   Filed Weights   01/19/23 1502  Weight: 236 lb (107 kg)    Physical Exam Vitals and nursing note reviewed.  HENT:     Head: Normocephalic and  atraumatic.     Mouth/Throat:     Pharynx: Oropharynx is clear.  Eyes:     Extraocular Movements: Extraocular movements intact.     Pupils: Pupils are equal, round, and reactive to light.  Cardiovascular:     Rate and Rhythm: Normal rate and regular rhythm.  Pulmonary:     Comments: Decreased breath sounds bilaterally.  Abdominal:     Palpations: Abdomen is soft.  Musculoskeletal:        General: Normal range of motion.     Cervical back: Normal range of motion.  Skin:    General: Skin is warm.  Neurological:     General: No focal deficit present.     Mental Status: She is alert and oriented to person, place, and time.  Psychiatric:  Behavior: Behavior normal.        Judgment: Judgment normal.      LABORATORY DATA:  I have reviewed the data as listed Lab Results  Component Value Date   WBC 11.6 (H) 01/16/2023   HGB 13.3 01/16/2023   HCT 41.5 01/16/2023   MCV 76.6 (L) 01/16/2023   PLT 304 01/16/2023   Recent Labs    11/05/22 0846 01/16/23 0904  NA 139 137  K 4.5 4.0  CL 105 105  CO2 24 22  GLUCOSE 104* 111*  BUN 9 14  CREATININE 0.63 0.69  CALCIUM 9.5 9.3  GFRNONAA  --  >60  PROT 7.6  --   AST 14  --   ALT 11  --   BILITOT 0.3  --      No results found.  ASSESSMENT & PLAN:   Symptomatic anemia # Anemia- Hb-11.7- Likely due to iron deficiency - from etiology GI blood loss/menorrhagia [see below].  Iron studies March 2024-severe iron deficiency. S/p IV venofer x3- no reactions.   # Today Hb 13.4; proceed with venofer today.  Poor tolerance of oral iron/constipation.  # Etiology of iron deficiency: Likely secondary to menorrhagia.  However, patient has not had screening colonoscopy.  Discussed re: referral to GI patient.  However patient declines, as she had a recent Cologuard negative.   # Menorrahagia- s/p gyn evaluation [Dr.Copeland].   # Vit D def -[march 2024- 20s]-noncompliant.  Again recommend vitamin D supplementation as per PCP.  #  DISPOSITION: # venofer today # follow up in 4 months MD; -labs- cbc;bmp; iron studies; ferritin- LDH; vit D 25- levels- possible venofer;-Dr.B  All questions were answered. The patient knows to call the clinic with any problems, questions or concerns.   Earna Coder, MD 01/19/2023 3:39 PM

## 2023-02-04 ENCOUNTER — Ambulatory Visit: Payer: BC Managed Care – PPO | Admitting: Family Medicine

## 2023-02-05 ENCOUNTER — Other Ambulatory Visit: Payer: Self-pay | Admitting: Family Medicine

## 2023-02-05 DIAGNOSIS — J329 Chronic sinusitis, unspecified: Secondary | ICD-10-CM

## 2023-02-05 NOTE — Telephone Encounter (Signed)
Requested Prescriptions  Refused Prescriptions Disp Refills   fluticasone (FLONASE) 50 MCG/ACT nasal spray [Pharmacy Med Name: FLUTICASONE PROP 50 MCG SPRAY] 48 mL 1    Sig: PLACE 2 SPRAYS INTO BOTH NOSTRILS DAILY. USE FOR 4-6 WEEKS THEN STOP AND USE SEASONALLY OR AS NEEDED.     Ear, Nose, and Throat: Nasal Preparations - Corticosteroids Passed - 02/05/2023  2:15 AM      Passed - Valid encounter within last 12 months    Recent Outpatient Visits           3 months ago Annual physical exam   [place] [facility] Smitty Cords, DO   7 months ago Morbid obesity [facility])   [place] [facility] Smitty Cords, DO   11 months ago Morbid obesity [facility])   [place] [facility] Smitty Cords, DO   1 year ago Morbid obesity [facility])   [facility] Smitty Cords, DO   1 year ago Pre-diabetes   [place] [facility] Althea Charon, Netta Neat, DO       Future Appointments             In 5 days Althea Charon, Netta Neat, DO  [facility] [place]

## 2023-02-10 ENCOUNTER — Ambulatory Visit (INDEPENDENT_AMBULATORY_CARE_PROVIDER_SITE_OTHER): Payer: BC Managed Care – PPO | Admitting: Family Medicine

## 2023-02-10 DIAGNOSIS — F5101 Primary insomnia: Secondary | ICD-10-CM

## 2023-02-10 MED ORDER — ZOLPIDEM TARTRATE ER 6.25 MG PO TBCR
6.2500 mg | EXTENDED_RELEASE_TABLET | Freq: Every evening | ORAL | 5 refills | Status: DC | PRN
Start: 2023-02-10 — End: 2023-08-24

## 2023-02-10 MED ORDER — ZEPBOUND 2.5 MG/0.5ML ~~LOC~~ SOAJ
2.5000 mg | SUBCUTANEOUS | 0 refills | Status: DC
Start: 2023-02-10 — End: 2023-10-09

## 2023-02-10 NOTE — Patient Instructions (Addendum)
Thank you for coming to the office today.  Start new rx Zepbound 2.5mg  weekly injection 4 pens per pack, monthly orders, we will need to dose increase after first 4 weeks, if you can get this one covered. Let me know phone or message if you are doing well on it, we can increase to 5mg .  Stay tuned 1 week until insurance approval or denial.  Refilled ZOlpidem CR can cause dizzy drowsy, less likely to cause the random vertigo.  For Weight Loss / Obesity only  Alternative options  Semaglutide injection (mixed Ozempic) from [facility] 0.25mg  weekly for 4 weeks then increase to 0.5mg  weekly It comes in a vial and a needle syringe, you need to draw up the shot and self admin it weekly Cost is about $200 per month Call them to check pricing and availability  [facility] Address: [address] [place], [place] [address] Phone: [phone]  --------------------------------  [facility] in [place], does not accept insurance. Call to schedule / Walk in to schedule They can do the weekly weight loss injections (same as Ozempic) Pay per visit and per shot. It may be approximately $60-75+ per visit/shot, approximately $200-300 range per month depending  [facility] Address: [address], [place], [place] [address] Phone: [phone]    Please schedule a Follow-up Appointment to: Return in about 3 months (around 05/13/2023), or if symptoms worsen or fail to improve, for 3 month weight check if on zepbound.  If you have any other questions or concerns, please feel free to call the office or send a message through MyChart. You may also schedule an earlier appointment if necessary.  Additionally, you may be receiving a survey about your experience at our office within a few days to 1 week by e-mail or mail. We value your feedback.  Saralyn Pilar, DO [facility], [place]

## 2023-02-10 NOTE — Progress Notes (Signed)
Subjective:    Patient ID: Beth [name], female    DOB: 14-Sep-1970, 52 y.o.   MRN: [id number]  Beth [name] is a 52 y.o. female presenting on 02/10/2023 for Obesity (Weight management )   HPI  Morbid Obesity BMI >43 Weight Management Previously on Wegovy, came off in 2023 when out of stock Tried Contrave, [place]  Insomnia Sleep maintenance problem May fall asleep fairly easily, will wake up every few hours it seems throughout the night No obvious symptoms waking her up Admits anxiety, things several things on the mind but unsure if this is the answer. Tried avoid screen time 1 hr before, white noise rain, tea, bath before. Tried OTC sleep med, melatonin, Zquill Not on anxiety med Admits peri menopausal  She has taken Zolpidem CR 6.25mg  intermittently, not every night but does take it often next day to help improve her sleep.       02/10/2023    3:49 PM 11/18/2022   12:03 PM 11/05/2022    8:36 AM  Depression screen PHQ 2/9  Decreased Interest 1 1 2   Down, Depressed, Hopeless 1 1 1   PHQ - 2 Score 2 2 3   Altered sleeping 2  1  Tired, decreased energy 2  1  Change in appetite 1  3  Feeling bad or failure about yourself  1  0  Trouble concentrating 0  2  Moving slowly or fidgety/restless 0  0  Suicidal thoughts 1  0  PHQ-9 Score 9  10  Difficult doing work/chores Not difficult at all  Somewhat difficult    Social History   Tobacco Use   Smoking status: Never   Smokeless tobacco: Never  Vaping Use   Vaping Use: Never used  Substance Use Topics   Alcohol use: Yes    Alcohol/week: 2.0 standard drinks of alcohol    Types: 2 Standard drinks or equivalent per week    Comment: occasionally   Drug use: No    Review of Systems Per HPI unless specifically indicated above     Objective:    BP 118/80 (BP Location: Left Arm, Patient Position: Sitting, Cuff Size: Large)   Pulse (!) 54   Temp 98.2 F (36.8 C) (Oral)   Resp 17   Ht 5\' 2"  (1.575 m)   Wt 237 lb  (107.5 kg)   SpO2 98%   BMI 43.35 kg/m   Wt Readings from Last 3 Encounters:  02/10/23 237 lb (107.5 kg)  01/19/23 236 lb (107 kg)  11/18/22 231 lb (104.8 kg)    Physical Exam Vitals and nursing note reviewed.  Constitutional:      General: She is not in acute distress.    Appearance: She is well-developed. She is obese. She is not diaphoretic.     Comments: Well-appearing, comfortable, cooperative  HENT:     Head: Normocephalic and atraumatic.  Eyes:     General:        Right eye: No discharge.        Left eye: No discharge.     Conjunctiva/sclera: Conjunctivae normal.  Neck:     Thyroid: No thyromegaly.  Cardiovascular:     Rate and Rhythm: Normal rate and regular rhythm.     Heart sounds: Normal heart sounds. No murmur heard. Pulmonary:     Effort: Pulmonary effort is normal. No respiratory distress.     Breath sounds: Normal breath sounds. No wheezing or rales.  Musculoskeletal:        General: Normal range  of motion.     Cervical back: Normal range of motion and neck supple.  Lymphadenopathy:     Cervical: No cervical adenopathy.  Skin:    General: Skin is warm and dry.     Findings: No erythema or rash.  Neurological:     Mental Status: She is alert and oriented to person, place, and time.  Psychiatric:        Behavior: Behavior normal.     Comments: Well groomed, good eye contact, normal speech and thoughts    Results for orders placed or performed in visit on 01/16/23  Lactate dehydrogenase  Result Value Ref Range   LDH 105 98 - 192 U/L  Basic Metabolic Panel - Cancer Center Only  Result Value Ref Range   Sodium 137 135 - 145 mmol/L   Potassium 4.0 3.5 - 5.1 mmol/L   Chloride 105 98 - 111 mmol/L   CO2 22 22 - 32 mmol/L   Glucose, Bld 111 (H) 70 - 99 mg/dL   BUN 14 6 - 20 mg/dL   Creatinine 3.82 5.05 - 1.00 mg/dL   Calcium 9.3 8.9 - 39.7 mg/dL   GFR, Estimated >67 >34 mL/min   Anion gap 10 5 - 15  CBC with Differential (Cancer Center Only)  Result  Value Ref Range   WBC Count 11.6 (H) 4.0 - 10.5 K/uL   RBC 5.42 (H) 3.87 - 5.11 MIL/uL   Hemoglobin 13.3 12.0 - 15.0 g/dL   HCT 19.3 79.0 - 24.0 %   MCV 76.6 (L) 80.0 - 100.0 fL   MCH 24.5 (L) 26.0 - 34.0 pg   MCHC 32.0 30.0 - 36.0 g/dL   RDW 97.3 (H) 53.2 - 99.2 %   Platelet Count 304 150 - 400 K/uL   nRBC 0.0 0.0 - 0.2 %   Neutrophils Relative % 61 %   Neutro Abs 7.1 1.7 - 7.7 K/uL   Lymphocytes Relative 28 %   Lymphs Abs 3.2 0.7 - 4.0 K/uL   Monocytes Relative 8 %   Monocytes Absolute 1.0 0.1 - 1.0 K/uL   Eosinophils Relative 1 %   Eosinophils Absolute 0.1 0.0 - 0.5 K/uL   Basophils Relative 1 %   Basophils Absolute 0.1 0.0 - 0.1 K/uL   Immature Granulocytes 1 %   Abs Immature Granulocytes 0.09 (H) 0.00 - 0.07 K/uL      Assessment & Plan:   Problem List Items Addressed This Visit     Morbid obesity (HCC) - Primary   Relevant Medications   ZEPBOUND 2.5 MG/0.5ML Pen   Other Visit Diagnoses     Primary insomnia       Relevant Medications   zolpidem (AMBIEN CR) 6.25 MG CR tablet       Morbid Obesity BMI >43 Abnormal weight gain Unable to successfully lose weight with lifestyle intervention Past success on Ozempic, Wegovy, now not covered Start new rx Zepbound 2.5mg  weekly injection 1 month supply Stay tuned 1 week until insurance approval or denial.  Refilled Zolpidem CR 6.25mg  low dose, caution can cause dizzy drowsy, less likely to cause the random vertigo.  Meds ordered this encounter  Medications   ZEPBOUND 2.5 MG/0.5ML Pen    Sig: Inject 2.5 mg into the skin once a week.    Dispense:  2 mL    Refill:  0   zolpidem (AMBIEN CR) 6.25 MG CR tablet    Sig: Take 1 tablet (6.25 mg total) by mouth at bedtime as needed for  sleep.    Dispense:  30 tablet    Refill:  5      Follow up plan: Return in about 3 months (around 05/13/2023), or if symptoms worsen or fail to improve, for 3 month weight check if on zepbound.   Saralyn Pilar, DO [facility] 02/10/2023, 4:05 PM

## 2023-03-09 ENCOUNTER — Telehealth: Payer: Self-pay

## 2023-03-09 NOTE — Telephone Encounter (Signed)
(  Key: [id number])   [employer] [place] is processing your PA request and will respond shortly with next steps. You may close this dialog, return to your dashboard, and perform other tasks. To check for an update later, open this request again from your dashboard.  If you need assistance, please chat with CoverMyMeds or call us at [phone].  Patient Assistance Patient assistance and financial support may be available through the manufacturer's patient savings program. For more information, and to see program requirements, follow the link below:  Patient Savings Program: click here.  Diagnosis ZEPBOUNDT (tirzepatide) is indicated as an adjunct to a reduced-calorie diet and increased physical activity for chronic weight management in adults with an initial body mass index (BMI) of: 30 kg/m2 or greater (obesity) or 27 kg/m2 or greater (overweight) in the presence of at least one weight-related comorbid condition (e.g., hypertension, dyslipidemia, type 2 diabetes mellitus, obstructive sleep apnea, or cardiovascular disease). Limitations of Use: ZEPBOUND (tirzepatide) contains tirzepatide. Coadministration with other tirzepatide-containing products or with any glucagon-like peptide-1 (GLP-1) receptor agonist is not recommended. The safety and efficacy of ZEPBOUND (tirzepatide) in combination with other products intended for weight management, including prescription drugs, over-the-counter drugs, and herbal preparations, have not been established. ZEPBOUND (tirzepatide) has not been studied in patients with a history of pancreatitis.   WARNING: RISK OF THYROID C-CELL TUMORS In rats, tirzepatide causes thyroid C-cell tumors. It is unknown whether ZEPBOUND causes thyroid C-cell tumors, including medullary thyroid carcinoma (MTC), in humans as the human relevance of tirzepatide-induced rodent thyroid C-cell tumors has not been determined. ZEPBOUND is contraindicated in patients with a personal or family  history of MTC or in patients with Multiple Endocrine Neoplasia syndrome type 2 (MEN 2). Counsel patients regarding the potential risk of MTC and symptoms of thyroid tumors.

## 2023-03-10 NOTE — Telephone Encounter (Signed)
This encounter was created in error - please disregard.

## 2023-03-10 NOTE — Telephone Encounter (Signed)
(  Key: [id number])   [employer] [place] has not yet replied to your PA request. You may close this dialog, return to your dashboard, and perform other tasks.  To check for an update later, open this request again from your dashboard.  If [employer] [place] has not replied to your request within 24 hours for an urgent request or 72 hours for a standard request, please contact [employer]  at [phone] option 3, then option 3 again.

## 2023-03-11 NOTE — Telephone Encounter (Signed)
(  Key: [id number]) PA Case ID #: [id number] Rx #: [id number] Need Help? Call us at [phone] Outcome N/A on July 23 Authorization On File.Authorization On File.Please note that authorization [id number] is already on file for the requested medication and is effective through 03/10/2023 10:36. Please contact the pharmacy for processing. Thank you.

## 2023-05-18 ENCOUNTER — Inpatient Hospital Stay: Payer: BC Managed Care – PPO

## 2023-05-18 ENCOUNTER — Inpatient Hospital Stay: Payer: BC Managed Care – PPO | Attending: Internal Medicine

## 2023-05-18 ENCOUNTER — Inpatient Hospital Stay (HOSPITAL_BASED_OUTPATIENT_CLINIC_OR_DEPARTMENT_OTHER): Payer: BC Managed Care – PPO | Admitting: Internal Medicine

## 2023-05-18 ENCOUNTER — Encounter: Payer: Self-pay | Admitting: Internal Medicine

## 2023-05-18 VITALS — BP 114/73 | HR 83

## 2023-05-18 VITALS — BP 133/88 | HR 87 | Temp 98.6°F | Resp 18 | Ht 62.0 in | Wt 236.4 lb

## 2023-05-18 DIAGNOSIS — D649 Anemia, unspecified: Secondary | ICD-10-CM

## 2023-05-18 DIAGNOSIS — D500 Iron deficiency anemia secondary to blood loss (chronic): Secondary | ICD-10-CM | POA: Diagnosis not present

## 2023-05-18 DIAGNOSIS — N920 Excessive and frequent menstruation with regular cycle: Secondary | ICD-10-CM | POA: Insufficient documentation

## 2023-05-18 DIAGNOSIS — E559 Vitamin D deficiency, unspecified: Secondary | ICD-10-CM | POA: Diagnosis not present

## 2023-05-18 DIAGNOSIS — R5383 Other fatigue: Secondary | ICD-10-CM | POA: Diagnosis not present

## 2023-05-18 DIAGNOSIS — K5900 Constipation, unspecified: Secondary | ICD-10-CM | POA: Diagnosis not present

## 2023-05-18 LAB — CBC WITH DIFFERENTIAL (CANCER CENTER ONLY)
Abs Immature Granulocytes: 0.06 10*3/uL (ref 0.00–0.07)
Basophils Absolute: 0.1 10*3/uL (ref 0.0–0.1)
Basophils Relative: 1 %
Eosinophils Absolute: 0.1 10*3/uL (ref 0.0–0.5)
Eosinophils Relative: 1 %
HCT: 39.5 % (ref 36.0–46.0)
Hemoglobin: 13.3 g/dL (ref 12.0–15.0)
Immature Granulocytes: 1 %
Lymphocytes Relative: 30 %
Lymphs Abs: 3.1 10*3/uL (ref 0.7–4.0)
MCH: 27.1 pg (ref 26.0–34.0)
MCHC: 33.7 g/dL (ref 30.0–36.0)
MCV: 80.6 fL (ref 80.0–100.0)
Monocytes Absolute: 0.7 10*3/uL (ref 0.1–1.0)
Monocytes Relative: 7 %
Neutro Abs: 6.1 10*3/uL (ref 1.7–7.7)
Neutrophils Relative %: 60 %
Platelet Count: 279 10*3/uL (ref 150–400)
RBC: 4.9 MIL/uL (ref 3.87–5.11)
RDW: 13.9 % (ref 11.5–15.5)
WBC Count: 10.1 10*3/uL (ref 4.0–10.5)
nRBC: 0 % (ref 0.0–0.2)

## 2023-05-18 LAB — IRON AND TIBC
Iron: 56 ug/dL (ref 28–170)
Saturation Ratios: 13 % (ref 10.4–31.8)
TIBC: 438 ug/dL (ref 250–450)
UIBC: 382 ug/dL

## 2023-05-18 LAB — BASIC METABOLIC PANEL
Anion gap: 7 (ref 5–15)
BUN: 11 mg/dL (ref 6–20)
CO2: 24 mmol/L (ref 22–32)
Calcium: 9.2 mg/dL (ref 8.9–10.3)
Chloride: 105 mmol/L (ref 98–111)
Creatinine, Ser: 0.59 mg/dL (ref 0.44–1.00)
GFR, Estimated: >60 mL/min (ref >60)
Glucose, Bld: 133 mg/dL — ABNORMAL HIGH (ref 70–99)
Potassium: 3.7 mmol/L (ref 3.5–5.1)
Sodium: 136 mmol/L (ref 135–145)

## 2023-05-18 LAB — FERRITIN: Ferritin: 23 ng/mL (ref 11–307)

## 2023-05-18 LAB — LACTATE DEHYDROGENASE: LDH: 103 U/L (ref 98–192)

## 2023-05-18 LAB — VITAMIN D 25 HYDROXY (VIT D DEFICIENCY, FRACTURES): Vit D, 25-Hydroxy: 10.45 ng/mL — ABNORMAL LOW (ref 30–100)

## 2023-05-18 MED ORDER — SODIUM CHLORIDE 0.9 % IV SOLN
200.0000 mg | Freq: Once | INTRAVENOUS | Status: AC
  Administered 2023-05-18: 200 mg via INTRAVENOUS
  Filled 2023-05-18: qty 200

## 2023-05-18 MED ORDER — SODIUM CHLORIDE 0.9 % IV SOLN
Freq: Once | INTRAVENOUS | Status: AC
  Filled 2023-05-18: qty 250

## 2023-05-18 NOTE — Progress Notes (Signed)
This [facility] CONSULT NOTE  Patient Care Team: Smitty Cords, DO as PCP - General (Family Medicine) Earna Coder, MD as Consulting Physician (Internal Medicine)  CHIEF COMPLAINTS/PURPOSE OF CONSULTATION: ANEMIA   HEMATOLOGY HISTORY  # ANEMIA[Hb; MCV-platelets- WBC; Iron sat; ferritin;  GFR- CT/US- ;  EGD-none/colonoscopy- none [cologard- neg as per pt]   Latest Reference Range & Units 11/05/22 08:46  Iron 40 - 190 mcg/dL 26 (L)  TIBC 540 - 981 mcg/dL (calc) 191 (H)  %SAT 16 - 45 % (calc) 5 (L)  Ferritin 16 - 232 ng/mL 9 (L)  Alkaline phosphatase (APISO) 31 - 125 U/L 86  Vitamin D, 25-Hydroxy 30 - 100 ng/mL 13 (L)  (L): Data is abnormally low (H): Data is abnormally high  HISTORY OF PRESENTING ILLNESS: Patient ambulating-independently. Alone.   Beth [name] 52 y.o.  female [place] iron deficiency anemia secondary positive menorrhagia is here for follow-up.  Pt is doing well. Menstral cycles do cause her to need naps. Last cycle was last week 9/22 ( normal heavy) and she was not too fatigued this time. Denies dyspnea or dizziness. No visible blood in stools.   Denies having any reactions to IV iron infusions.   Review of Systems  Constitutional:  Positive for malaise/fatigue. Negative for chills, diaphoresis, fever and weight loss.  HENT:  Negative for nosebleeds and sore throat.   Eyes:  Negative for double vision.  Respiratory:  Negative for cough, hemoptysis, sputum production, shortness of breath and wheezing.   Cardiovascular:  Negative for chest pain, palpitations, orthopnea and leg swelling.  Gastrointestinal:  Negative for abdominal pain, blood in stool, constipation, diarrhea, heartburn, melena, nausea and vomiting.  Genitourinary:  Negative for dysuria, frequency and urgency.  Musculoskeletal:  Negative for back pain and joint pain.  Skin: Negative.  Negative for itching and rash.  Neurological:  Negative for dizziness,  tingling, focal weakness, weakness and headaches.  Endo/Heme/Allergies:  Does not bruise/bleed easily.  Psychiatric/Behavioral:  Negative for depression. The patient is not nervous/anxious and does not have insomnia.    MEDICAL HISTORY:  Past Medical History:  Diagnosis Date   Anemia    Anxiety    Depression    GERD (gastroesophageal reflux disease)    Kidney stone    kidney stone    SURGICAL HISTORY: Past Surgical History:  Procedure Laterality Date   HERNIA REPAIR  06/11/2021   TUBAL LIGATION      SOCIAL HISTORY: Social History   Socioeconomic History   Marital status: Divorced    Spouse name: Not on file   Number of children: 4   Years of education: Not on file   Highest education level: Associate degree: occupational, Scientist, product/process development, or vocational program  Occupational History   Not on file  Tobacco Use   Smoking status: Never   Smokeless tobacco: Never  Vaping Use   Vaping status: Never Used  Substance and Sexual Activity   Alcohol use: Yes    Alcohol/week: 2.0 standard drinks of alcohol    Types: 2 Standard drinks or equivalent per week    Comment: occasionally   Drug use: No   Sexual activity: Yes    Birth control/protection: None  Other Topics Concern   Not on file  Social History Narrative   Lives in [place]; via health care. With son/daughter. No smoking; rare alcohol.    Social Determinants of Health   Financial Resource Strain: Low Risk  (02/10/2023)   Overall Financial Resource Strain (CARDIA)  Difficulty of Paying Living Expenses: Not very hard  Recent Concern: Financial Resource Strain - Medium Risk (12/02/2022)   Overall Financial Resource Strain (CARDIA)    Difficulty of Paying Living Expenses: Somewhat hard  Food Insecurity: No Food Insecurity (02/10/2023)   Hunger Vital Sign    Worried About Running Out of Food in the Last Year: Never true    Ran Out of Food in the Last Year: Never true  Transportation Needs: No Transportation Needs  (02/10/2023)   PRAPARE - Administrator, Civil Service (Medical): No    Lack of Transportation (Non-Medical): No  Physical Activity: Inactive (02/10/2023)   Exercise Vital Sign    Days of Exercise per Week: 0 days    Minutes of Exercise per Session: 30 min  Stress: No Stress Concern Present (02/10/2023)   [employer] of Occupational Health - Occupational Stress Questionnaire    Feeling of Stress : Only a little  Recent Concern: Stress - Stress Concern Present (12/02/2022)   [employer] of Occupational Health - Occupational Stress Questionnaire    Feeling of Stress : To some extent  Social Connections: Socially Isolated (02/10/2023)   Social Connection and Isolation Panel [NHANES]    Frequency of Communication with Friends and Family: Once a week    Frequency of Social Gatherings with Friends and Family: Once a week    Attends Religious Services: Never    Database administrator or Organizations: No    Attends Engineer, structural: 1 to 4 times per year    Marital Status: Divorced  Catering manager Violence: Not At Risk (11/18/2022)   Humiliation, Afraid, Rape, and Kick questionnaire    Fear of Current or Ex-Partner: No    Emotionally Abused: No    Physically Abused: No    Sexually Abused: No    FAMILY HISTORY: Family History  Problem Relation Age of Onset   Diabetes Mother    Hypertension Mother    Diabetes Father    Hypertension Father    Cancer Paternal Uncle    Diabetes Maternal Grandmother    Heart disease Maternal Grandmother    Diabetes Maternal Grandfather    Diabetes Paternal Grandmother    Diabetes Paternal Grandfather     ALLERGIES:  has No Known Allergies.  MEDICATIONS:  Current Outpatient Medications  Medication Sig Dispense Refill   famotidine (PEPCID) 20 MG tablet Take 1 tablet (20 mg total) by mouth 2 (two) times daily. (Patient taking differently: Take 20 mg by mouth 2 (two) times daily. Pt reports taking PRN) 60 tablet 0    zolpidem (AMBIEN CR) 6.25 MG CR tablet Take 1 tablet (6.25 mg total) by mouth at bedtime as needed for sleep. 30 tablet 5   ZEPBOUND 2.5 MG/0.5ML Pen Inject 2.5 mg into the skin once a week. (Patient not taking: Reported on 05/18/2023) 2 mL 0   No current facility-administered medications for this visit.   Facility-Administered Medications Ordered in Other Visits  Medication Dose Route Frequency Provider Last Rate Last Admin   iron sucrose (VENOFER) 200 mg in sodium chloride 0.9 % 100 mL IVPB  200 mg Intravenous Once Earna Coder, MD 440 mL/hr at 05/18/23 1356 200 mg at 05/18/23 1356     PHYSICAL EXAMINATION:   Vitals:   05/18/23 1326  BP: 133/88  Pulse: 87  Resp: 18  Temp: 98.6 F (37 C)  SpO2: 97%   Filed Weights   05/18/23 1326  Weight: 236 lb 6.4 oz (107.2 kg)  Physical Exam Vitals and nursing note reviewed.  HENT:     Head: Normocephalic and atraumatic.     Mouth/Throat:     Pharynx: Oropharynx is clear.  Eyes:     Extraocular Movements: Extraocular movements intact.     Pupils: Pupils are equal, round, and reactive to light.  Cardiovascular:     Rate and Rhythm: Normal rate and regular rhythm.  Pulmonary:     Comments: Decreased breath sounds bilaterally.  Abdominal:     Palpations: Abdomen is soft.  Musculoskeletal:        General: Normal range of motion.     Cervical back: Normal range of motion.  Skin:    General: Skin is warm.  Neurological:     General: No focal deficit present.     Mental Status: She is alert and oriented to person, place, and time.  Psychiatric:        Behavior: Behavior normal.        Judgment: Judgment normal.      LABORATORY DATA:  I have reviewed the data as listed Lab Results  Component Value Date   WBC 10.1 05/18/2023   HGB 13.3 05/18/2023   HCT 39.5 05/18/2023   MCV 80.6 05/18/2023   PLT 279 05/18/2023   Recent Labs    11/05/22 0846 01/16/23 0904 05/18/23 1241  NA 139 137 136  K 4.5 4.0 3.7  CL  105 105 105  CO2 24 22 24   GLUCOSE 104* 111* 133*  BUN 9 14 11   CREATININE 0.63 0.69 0.59  CALCIUM 9.5 9.3 9.2  GFRNONAA  --  >60 >60  PROT 7.6  --   --   AST 14  --   --   ALT 11  --   --   BILITOT 0.3  --   --      No results found.  ASSESSMENT & PLAN:   Symptomatic anemia # Anemia- Hb-11.7- Likely due to iron deficiency - from etiology blood loss/menorrhagia [see below].  Iron studies March 2024-iron sat-5%; ferritin-9; severe iron deficiency. S/p IV venofer x3- no reactions.   # Today Hb 13.4; proceed with venofer today.  Poor tolerance of oral iron/constipation. #Recommend gentle iron [iron biglycinate; 28 mg ] 1 pill a day.  This pill is unlikely to cause stomach upset or cause constipation.   # Etiology of iron deficiency: Likely secondary to menorrhagia.  However, patient has not had screening colonoscopy.  Discussed re: referral to GI patient.  However patient declines, as she had a recent Cologuard negative.   # Menorrahagia- s/p gyn evaluation [Dr.Copeland].   # Vit D def -[march 2024- 20s]-noncompliant. SEP vitamin D-P.   # DISPOSITION: # venofer today # follow up in 6 months MD; -labs- cbc;bmp; iron studies; ferritin- LDH levels- possible venofer;-Dr.B  All questions were answered. The patient knows to call the clinic with any problems, questions or concerns.   Earna Coder, MD 05/18/2023 1:58 PM

## 2023-05-18 NOTE — Patient Instructions (Signed)
#  Recommend gentle iron [iron biglycinate; 28 mg ] 1 pill a day.  This pill is unlikely to cause stomach upset or cause constipation.  

## 2023-05-18 NOTE — Progress Notes (Signed)
Pt is doing well. Menstral cycles do cause her to need naps. Last cycle was last week 9/22 ( normal heavy) and she was not too fatigued this time. Denies dyspnea or dizziness. No visible blood in stools.

## 2023-05-18 NOTE — Assessment & Plan Note (Addendum)
#   Anemia- Hb-11.7- Likely due to iron deficiency - from etiology blood loss/menorrhagia [see below].  Iron studies March 2024-iron sat-5%; ferritin-9; severe iron deficiency. S/p IV venofer x3- no reactions.   # Today Hb 13.4; proceed with venofer today.  Poor tolerance of oral iron/constipation. #Recommend gentle iron [iron biglycinate; 28 mg ] 1 pill a day.  This pill is unlikely to cause stomach upset or cause constipation.   # Etiology of iron deficiency: Likely secondary to menorrhagia.  However, patient has not had screening colonoscopy.  Discussed re: referral to GI patient.  However patient declines, as she had a recent Cologuard negative.   # Menorrahagia- s/p gyn evaluation [Dr.Copeland].   # Vit D def -[march 2024- 20s]-noncompliant. SEP vitamin D-P.   # DISPOSITION: # venofer today # follow up in 6 months MD; -labs- cbc;bmp; iron studies; ferritin- LDH levels- possible venofer;-Dr.B

## 2023-05-18 NOTE — Progress Notes (Signed)
Patient declined to wait the 30 minutes for post iron infusion observation today. Tolerated infusion well. VSS. 

## 2023-05-18 NOTE — Progress Notes (Signed)
Fatigue/weakness: Dyspena: Light headedness: Blood in stool:

## 2023-05-20 ENCOUNTER — Telehealth: Payer: Self-pay

## 2023-05-20 ENCOUNTER — Other Ambulatory Visit: Payer: Self-pay | Admitting: *Deleted

## 2023-05-20 DIAGNOSIS — R7989 Other specified abnormal findings of blood chemistry: Secondary | ICD-10-CM

## 2023-05-20 MED ORDER — ERGOCALCIFEROL 1.25 MG (50000 UT) PO CAPS: 50000.0000 [IU] | ORAL_CAPSULE | ORAL | 1 refills | Status: DC

## 2023-05-20 NOTE — Telephone Encounter (Signed)
Per secure chat with Dr. B: Please inform patient that her vitamin D levels are low. Please check if she is c taking her vitamin D. If not I would recommend vitamin D2- 50,000 units once a week, if needed send a prescription for 12 pills with 1 refill. Add vitamin D levels at the next visit.    Michelle B. put in Vit D rx, I called and left message for pt to call us back.

## 2023-05-21 NOTE — Telephone Encounter (Signed)
Pt notified, she will start rx vit d weekly.

## 2023-05-28 DIAGNOSIS — R7303 Prediabetes: Secondary | ICD-10-CM | POA: Diagnosis not present

## 2023-06-06 DIAGNOSIS — R7303 Prediabetes: Secondary | ICD-10-CM | POA: Diagnosis not present

## 2023-06-10 NOTE — Progress Notes (Signed)
(  Key: [id number])  form thumbnail [employer] [place] is processing your PA request and will respond shortly with next steps. You may close this dialog, return to your dashboard, and perform other tasks. To check for an update later, open this request again from your dashboard.  If you need assistance, please chat with CoverMyMeds or call us at [phone].

## 2023-06-10 NOTE — Progress Notes (Signed)
(Key: [id number]) PA Case ID #: [id number] Need Help? Call us at [phone] Status sent iconSent to Plan today Drug Zepbound 2.5MG /0.5ML pen-injectors ePA cloud logo Form [employer] Electronic Request Form The following information is provided by CoverMyMeds.  Patient Assistance Patient assistance and financial support may be available through the manufacturer's patient savings program. For more information, and to see program requirements, follow the link below:  Patient Savings Program: click here.  Diagnosis Zepbound is indicated as an adjunct to a reduced-calorie diet and increased physical activity for chronic weight management in adults with an initial body mass index (BMI) of: 30 kg/m2 or greater (obesity) or 27 kg/m2 or greater (overweight) in the presence of at least one weight-related comorbid condition (e.g., hypertension, dyslipidemia, type 2 diabetes mellitus, obstructive sleep apnea, or cardiovascular disease). Limitations of Use: Zepbound contains tirzepatide. Coadministration with other tirzepatide-containing products or with any glucagon-like peptide-1 (GLP-1) receptor agonist is not recommended. The safety and efficacy of Zepbound in combination with other products intended for weight management, including prescription drugs, over-the-counter drugs, and herbal preparations, have not been established. Zepbound has not been studied in patients with a history of pancreatitis.   WARNING: RISK OF THYROID C-CELL TUMORS In rats, tirzepatide causes dose-dependent and treatment-duration-dependent thyroid C-cell tumors at clinically relevant exposures. It is unknown whether Zepbound causes thyroid C-cell tumors, including medullary thyroid carcinoma (MTC), in humans as human relevance of tirzepatide-induced rodent thyroid C- cell tumors has not been determined. Zepbound is contraindicated in patients with a personal or family history of MTC or in patients with  Multiple Endocrine Neoplasia syndrome type 2 (MEN 2). Counsel patients regarding the potential risk for MTC with the use of Zepbound and inform them of symptoms of thyroid tumors (e.g., a mass in the neck, dysphagia, dyspnea, persistent hoarseness). Routine monitoring of serum calcitonin or using thyroid ultrasound is of uncertain value for early detection of MTC in patients treated with Zepbound.   Commonly used ICD-10 diagnosis code(s): E66* (Overweight and obesity) Z68* (Body mass index [BMI]) * Indicates multiple available codes within a diagnosis category  These codes are presented for informational purposes only. They represent no statement, promise, or guarantee concerning coverage and/or levels of reimbursement, payment, or charge, and are not intended to increase or maximize reimbursement by any payer. It is the responsibility of the healthcare provider to determine appropriate code(s) for services provided to their patient. Prescribing Information: click here. Important Safety Information: click here.  Please indicate the patient's previous medication history in a free text field (if available) for any of the following medications: Benzphetamine (Didrex) Bupropion-naltrexone (Contrave) Diethylpropion (Tenuate) Liraglutide (Saxenda) Orlistat (Alli; Xenical) Phendimetrazine Phentermine (Adipex-P; Lomaira) Phentermine-topiramate (Qsymia) Semaglutide [facility]) Other (Please specify)  Please also make note of the following in a free text field (if available): What is the patient's indication? Chronic Weight Management What is the patient's BMI? Other (Please specify) What is the patient's baseline weight/height/BMI prior to starting any chronic weight management therapy? Will the medication be used in combination with any other tirzepatide-containing products or any GLP-1 receptor agonist? (Y/N) If yes, please specify. Will the medication be used in combination with any other  weight loss products? (Y/N) If yes, please specify. Is this a request for an initiation or continuation of therapy? Initiation In the months leading into anti-obesity medication therapy, has the patient attempted diet and exercise, and/or behavioral or lifestyle modifications? (Y/N) If yes for at least 3 months preceding therapy, please provide details of patient's prior diet/exercise  attempts (e.g. specify number of kcal deficit per day and number of minutes of exercise per week).  If yes for at least 6 months preceding therapy, please provide details of patient's prior diet/exercise attempts (e.g. specify number of kcal deficit per day and number of minutes of exercise per week).  If yes for at least 12 months preceding therapy, please provide details of patient's prior diet/exercise attempts (e.g. specify number of kcal deficit per day and number of minutes of exercise per week).  Will the patient be on a reduced-calorie diet and exercise program concurrently with treatment? (Y/N) If yes, please provide details of patient's diet/exercise (e.g. specify number of kcal deficit per day and number of minutes of exercise per week). Was the patient successful in losing at least 5% of body weight with diet and exercise alone? (Y/N) Please specify.  Continuation Will the patient be concurrently participating in a reduced-calorie diet and exercise program alongside medication to manage their chronic weight management? (Y/N) Was the patient successful in losing at least 5% of body weight while on Zepbound? (e.g. patient has achieved weight loss since previous prior authorization) (Y/N) If yes, please provide updated weight and/or what percent body weight has been lost. Note: some plans may require complete documentation of diagnosis, BMI and any comorbidities (if applicable), concurrent lifestyle modification, and weight loss from baseline (if continuation). Where applicable, you may attach the patient's  medical and medication history to this request for the plan to review.

## 2023-06-12 NOTE — Progress Notes (Signed)
(  Key: [id number]) PA Case ID #: [id number] Need Help? Call us at [phone] Outcome Approved today by [facility] [employer] [facility] 2017 Approved. Authorization Expiration Date: 10/14/2023

## 2023-07-02 DIAGNOSIS — R7303 Prediabetes: Secondary | ICD-10-CM | POA: Diagnosis not present

## 2023-07-03 NOTE — Progress Notes (Unsigned)
[facility] [address] [place], [place] [address]  Pulmonary Sleep Medicine   Office Visit Note  Patient Name: Beth [name] DOB: 13-Sep-1970 MRN [id number]    Chief Complaint: Obstructive Sleep Apnea visit  Brief History:  Beth [name] is seen today for an annual follow up visit for CPAP@ 8 cmH2O. The patient has a 2 year history of sleep apnea. Patient is not using PAP nightly.  The patient feels rested after sleeping with PAP.  The patient reports benefit from PAP use. Reported sleepiness is  improved and the Epworth Sleepiness Score is 6 out of 24. The patient does not usually  take naps. The patient complains of the following: waking during the night and anxiety takes over and causes her to take off her mask. She will continue to work with keeping it on longer.   The compliance download shows 52% compliance with an average use time of 4 hours 20 minutes. The AHI is 0.1.  The patient does not complain of limb movements disrupting sleep. The patient continues to require PAP therapy in order to eliminate sleep apnea.   ROS  General: (-) fever, (-) chills, (-) night sweat Nose and Sinuses: (-) nasal stuffiness or itchiness, (-) postnasal drip, (-) nosebleeds, (-) sinus trouble. Mouth and Throat: (-) sore throat, (-) hoarseness. Neck: (-) swollen glands, (-) enlarged thyroid, (-) neck pain. Respiratory: - cough, - shortness of breath, - wheezing. Neurologic: - numbness, - tingling. Psychiatric: - anxiety, - depression   Current Medication: Outpatient Encounter Medications as of 07/06/2023  Medication Sig   ergocalciferol (VITAMIN D2) 1.25 MG (50000 UT) capsule Take 1 capsule (50,000 Units total) by mouth once a week.   famotidine (PEPCID) 20 MG tablet Take 1 tablet (20 mg total) by mouth 2 (two) times daily. (Patient taking differently: Take 20 mg by mouth 2 (two) times daily. Pt reports taking PRN)   ZEPBOUND 2.5 MG/0.5ML Pen Inject 2.5 mg into the skin once a week.  (Patient not taking: Reported on 05/18/2023)   zolpidem (AMBIEN CR) 6.25 MG CR tablet Take 1 tablet (6.25 mg total) by mouth at bedtime as needed for sleep.   [DISCONTINUED] albuterol (PROVENTIL HFA;VENTOLIN HFA) 108 (90 Base) MCG/ACT inhaler Inhale 2 puffs into the lungs every 6 (six) hours as needed for wheezing or shortness of breath.   [DISCONTINUED] clonazePAM (KLONOPIN) 0.5 MG tablet Take 1 tablet by mouth as needed.   No facility-administered encounter medications on file as of 07/06/2023.    Surgical History: Past Surgical History:  Procedure Laterality Date   HERNIA REPAIR  06/11/2021   TUBAL LIGATION      Medical History: Past Medical History:  Diagnosis Date   Anemia    Anxiety    Depression    GERD (gastroesophageal reflux disease)    Kidney stone    kidney stone    Family History: Non contributory to the present illness  Social History: Social History   Socioeconomic History   Marital status: Divorced    Spouse name: Not on file   Number of children: 4   Years of education: Not on file   Highest education level: Associate degree: occupational, Scientist, product/process development, or vocational program  Occupational History   Not on file  Tobacco Use   Smoking status: Never   Smokeless tobacco: Never  Vaping Use   Vaping status: Never Used  Substance and Sexual Activity   Alcohol use: Yes    Alcohol/week: 2.0 standard drinks of alcohol    Types: 2  Standard drinks or equivalent per week    Comment: occasionally   Drug use: No   Sexual activity: Yes    Birth control/protection: None  Other Topics Concern   Not on file  Social History Narrative   Lives in [place]; via health care. With son/daughter. No smoking; rare alcohol.    Social Determinants of Health   Financial Resource Strain: Low Risk  (02/10/2023)   Overall Financial Resource Strain (CARDIA)    Difficulty of Paying Living Expenses: Not very hard  Recent Concern: Financial Resource Strain - Medium Risk  (12/02/2022)   Overall Financial Resource Strain (CARDIA)    Difficulty of Paying Living Expenses: Somewhat hard  Food Insecurity: No Food Insecurity (02/10/2023)   Hunger Vital Sign    Worried About Running Out of Food in the Last Year: Never true    Ran Out of Food in the Last Year: Never true  Transportation Needs: No Transportation Needs (02/10/2023)   PRAPARE - Administrator, Civil Service (Medical): No    Lack of Transportation (Non-Medical): No  Physical Activity: Inactive (02/10/2023)   Exercise Vital Sign    Days of Exercise per Week: 0 days    Minutes of Exercise per Session: 30 min  Stress: No Stress Concern Present (02/10/2023)   [employer] of Occupational Health - Occupational Stress Questionnaire    Feeling of Stress : Only a little  Recent Concern: Stress - Stress Concern Present (12/02/2022)   [employer] of Occupational Health - Occupational Stress Questionnaire    Feeling of Stress : To some extent  Social Connections: Socially Isolated (02/10/2023)   Social Connection and Isolation Panel [NHANES]    Frequency of Communication with Friends and Family: Once a week    Frequency of Social Gatherings with Friends and Family: Once a week    Attends Religious Services: Never    Database administrator or Organizations: No    Attends Engineer, structural: 1 to 4 times per year    Marital Status: Divorced  Catering manager Violence: Not At Risk (11/18/2022)   Humiliation, Afraid, Rape, and Kick questionnaire    Fear of Current or Ex-Partner: No    Emotionally Abused: No    Physically Abused: No    Sexually Abused: No    Vital Signs: There were no vitals taken for this visit. There is no height or weight on file to calculate BMI.    Examination: General Appearance: The patient is well-developed, well-nourished, and in no distress. Neck Circumference: 40cm Skin: Gross inspection of skin unremarkable. Head: normocephalic, no gross  deformities. Eyes: no gross deformities noted. ENT: ears appear grossly normal Neurologic: Alert and oriented. No involuntary movements.  STOP BANG RISK ASSESSMENT S (snore) Have you been told that you snore?     No   T (tired) Are you often tired, fatigued, or sleepy during the day?   NO  O (obstruction) Do you stop breathing, choke, or gasp during sleep? NO   P (pressure) Do you have or are you being treated for high blood pressure? NO   B (BMI) Is your body index greater than 35 kg/m? YES   A (age) Are you 13 years old or older? YES   N (neck) Do you have a neck circumference greater than 16 inches?   NO   G (gender) Are you a female? NO   TOTAL STOP/BANG "YES" ANSWERS 2       A STOP-Bang score of 2 or  less is considered low risk, and a score of 5 or more is high risk for having either moderate or severe OSA. For people who score 3 or 4, doctors may need to perform further assessment to determine how likely they are to have OSA.         EPWORTH SLEEPINESS SCALE:  Scale:  (0)= no chance of dozing; (1)= slight chance of dozing; (2)= moderate chance of dozing; (3)= high chance of dozing  Chance  Situtation    Sitting and reading: 0    Watching TV: 2    Sitting Inactive in public: 0    As a passenger in car: 1      Lying down to rest: 3    Sitting and talking: 0    Sitting quielty after lunch: 0    In a car, stopped in traffic: 0   TOTAL SCORE:   6 out of 24    SLEEP STUDIES:  PSG (09/2021) AHI 7.1/Hr, min SpO2 88% Titration (10/2021) CPAP@ 8 cmH2O   CPAP COMPLIANCE DATA:  Date Range: 07/01/2022-06/30/2023  Average Daily Use: 4 hours 20 minutes  Median Use: 4 hours 21 minutes  Compliance for > 4 Hours: 52%  AHI: 0.1 respiratory events per hour  Days Used: 338/365 days  Mask Leak: 11  95th Percentile Pressure: 8         LABS: Recent Results (from the past 2160 hour(s))  VITAMIN D 25 Hydroxy (Vit-D Deficiency, Fractures)     Status:  Abnormal   Collection Time: 05/18/23 12:41 PM  Result Value Ref Range   Vit D, 25-Hydroxy 10.45 (L) 30 - 100 ng/mL    Comment: (NOTE) Vitamin D deficiency has been defined by the Institute of Medicine  and an Endocrine Society practice guideline as a level of serum 25-OH  vitamin D less than 20 ng/mL (1,2). The Endocrine Society went on to  further define vitamin D insufficiency as a level between 21 and 29  ng/mL (2).  1. IOM (Institute of Medicine). 2010. Dietary reference intakes for  calcium and D. [place]: [employer]. 2. Holick MF, [place], Bischoff-Ferrari HA, et al. Evaluation,  treatment, and prevention of vitamin D deficiency: an Endocrine  Society clinical practice guideline, JCEM. 2011 Jul; 96(7): 1911-30.  Performed at [facility], [address] [place], [place] [address]   Lactate dehydrogenase     Status: None   Collection Time: 05/18/23 12:41 PM  Result Value Ref Range   LDH 103 98 - 192 U/L    Comment: Performed at [facility], [address] [place], [place] [address]  Ferritin     Status: None   Collection Time: 05/18/23 12:41 PM  Result Value Ref Range   Ferritin 23 11 - 307 ng/mL    Comment: Performed at [facility], [address] [place], [place] [address]  Iron and TIBC     Status: None   Collection Time: 05/18/23 12:41 PM  Result Value Ref Range   Iron 56 28 - 170 ug/dL   TIBC 578 469 - 629 ug/dL   Saturation Ratios 13 10.4 - 31.8 %   UIBC 382 ug/dL    Comment: Performed at [facility], [address] [place], [place] [address]  Basic metabolic panel     Status: Abnormal   Collection Time: 05/18/23 12:41 PM  Result Value Ref Range   Sodium 136 135 - 145 mmol/L   Potassium 3.7 3.5 -  5.1 mmol/L   Chloride 105 98 - 111 mmol/L   CO2 24 22 - 32 mmol/L   Glucose, Bld 133 (H) 70 - 99 mg/dL    Comment: Glucose reference range applies only to samples taken after fasting for at least 8  hours.   BUN 11 6 - 20 mg/dL   Creatinine, Ser 1.61 0.44 - 1.00 mg/dL   Calcium 9.2 8.9 - 09.6 mg/dL   GFR, Estimated >04 >54 mL/min    Comment: (NOTE) Calculated using the CKD-EPI Creatinine Equation (2021)    Anion gap 7 5 - 15    Comment: Performed at [facility], [address] [place], [place] [address]  CBC with Differential (Cancer Center Only)     Status: None   Collection Time: 05/18/23 12:41 PM  Result Value Ref Range   WBC Count 10.1 4.0 - 10.5 K/uL   RBC 4.90 3.87 - 5.11 MIL/uL   Hemoglobin 13.3 12.0 - 15.0 g/dL   HCT 91.4 78.2 - 95.6 %   MCV 80.6 80.0 - 100.0 fL   MCH 27.1 26.0 - 34.0 pg   MCHC 33.7 30.0 - 36.0 g/dL   RDW 21.3 08.6 - 57.8 %   Platelet Count 279 150 - 400 K/uL   nRBC 0.0 0.0 - 0.2 %   Neutrophils Relative % 60 %   Neutro Abs 6.1 1.7 - 7.7 K/uL   Lymphocytes Relative 30 %   Lymphs Abs 3.1 0.7 - 4.0 K/uL   Monocytes Relative 7 %   Monocytes Absolute 0.7 0.1 - 1.0 K/uL   Eosinophils Relative 1 %   Eosinophils Absolute 0.1 0.0 - 0.5 K/uL   Basophils Relative 1 %   Basophils Absolute 0.1 0.0 - 0.1 K/uL   Immature Granulocytes 1 %   Abs Immature Granulocytes 0.06 0.00 - 0.07 K/uL    Comment: Performed at [facility], [address] [place], [place] [address]    Radiology: MM DIAG BREAST TOMO BILATERAL  Result Date: 10/09/2021 CLINICAL DATA:  Recall from screening mammography, possible BILATERAL breast masses. EXAM: DIGITAL DIAGNOSTIC BILATERAL MAMMOGRAM WITH TOMOSYNTHESIS AND CAD; ULTRASOUND RIGHT BREAST LIMITED; ULTRASOUND LEFT BREAST LIMITED TECHNIQUE: Bilateral digital diagnostic mammography and breast tomosynthesis was performed. The images were evaluated with computer-aided detection.; Targeted ultrasound examination of the right breast was performed; Targeted ultrasound examination of the left breast was performed. COMPARISON:  Mammography 09/27/2021.  No prior ultrasound. ACR Breast Density Category c: The breast tissue is  heterogeneously dense, which may obscure small masses. FINDINGS: Spot-compression CC and MLO views of the areas of concern in both breasts were obtained. RIGHT: Circumscribed isodense mass in the outer breast at anterior to middle depth measuring approximately 1 cm without associated architectural distortion or suspicious calcifications. Circumscribed isodense mass versus focal duct ectasia involving the upper subareolar location. There are there is no associated architectural distortion or suspicious calcifications. Targeted ultrasound is performed, demonstrating an oval parallel anechoic mass with scattered internal echoes at the 9 o'clock position 5 cm from the nipple measuring approximately 1.1 x 0.7 x 0.8 cm, demonstrating posterior acoustic enhancement and no internal power Doppler flow, corresponding to the screening mammographic finding. Mild duct ectasia is present in the upper subareolar location at 12 o'clock. No dominant cyst, solid mass or abnormal acoustic shadowing is identified. LEFT: Circumscribed isodense mass in the UPPER INNER QUADRANT at middle depth measuring just over 1 cm in size without associated architectural distortion or suspicious calcifications. Targeted ultrasound is performed, demonstrating an  oval parallel circumscribed anechoic mass with thin internal septations at the 10 o'clock position 6 cm from the nipple measuring approximately 1.3 x 1.1 x 1.1 cm, demonstrating posterior acoustic enhancement and no internal power Doppler flow, corresponding to the screening mammographic finding. There is a benign cyst immediately adjacent to this dominant cyst. No suspicious solid mass or abnormal acoustic shadowing is identified. IMPRESSION: Benign cysts in both breasts which account for the screening mammographic findings. RECOMMENDATION: Screening mammogram in one year.(Code:SM-B-01Y) I have discussed the findings and recommendations with the patient. If applicable, a reminder letter will  be sent to the patient regarding the next appointment. BI-RADS CATEGORY  2: Benign. Electronically Signed   By: Hulan Saas M.D.   On: 10/09/2021 11:00  US BREAST LTD UNI LEFT INC AXILLA  Result Date: 10/09/2021 CLINICAL DATA:  Recall from screening mammography, possible BILATERAL breast masses. EXAM: DIGITAL DIAGNOSTIC BILATERAL MAMMOGRAM WITH TOMOSYNTHESIS AND CAD; ULTRASOUND RIGHT BREAST LIMITED; ULTRASOUND LEFT BREAST LIMITED TECHNIQUE: Bilateral digital diagnostic mammography and breast tomosynthesis was performed. The images were evaluated with computer-aided detection.; Targeted ultrasound examination of the right breast was performed; Targeted ultrasound examination of the left breast was performed. COMPARISON:  Mammography 09/27/2021.  No prior ultrasound. ACR Breast Density Category c: The breast tissue is heterogeneously dense, which may obscure small masses. FINDINGS: Spot-compression CC and MLO views of the areas of concern in both breasts were obtained. RIGHT: Circumscribed isodense mass in the outer breast at anterior to middle depth measuring approximately 1 cm without associated architectural distortion or suspicious calcifications. Circumscribed isodense mass versus focal duct ectasia involving the upper subareolar location. There are there is no associated architectural distortion or suspicious calcifications. Targeted ultrasound is performed, demonstrating an oval parallel anechoic mass with scattered internal echoes at the 9 o'clock position 5 cm from the nipple measuring approximately 1.1 x 0.7 x 0.8 cm, demonstrating posterior acoustic enhancement and no internal power Doppler flow, corresponding to the screening mammographic finding. Mild duct ectasia is present in the upper subareolar location at 12 o'clock. No dominant cyst, solid mass or abnormal acoustic shadowing is identified. LEFT: Circumscribed isodense mass in the UPPER INNER QUADRANT at middle depth measuring just over 1 cm  in size without associated architectural distortion or suspicious calcifications. Targeted ultrasound is performed, demonstrating an oval parallel circumscribed anechoic mass with thin internal septations at the 10 o'clock position 6 cm from the nipple measuring approximately 1.3 x 1.1 x 1.1 cm, demonstrating posterior acoustic enhancement and no internal power Doppler flow, corresponding to the screening mammographic finding. There is a benign cyst immediately adjacent to this dominant cyst. No suspicious solid mass or abnormal acoustic shadowing is identified. IMPRESSION: Benign cysts in both breasts which account for the screening mammographic findings. RECOMMENDATION: Screening mammogram in one year.(Code:SM-B-01Y) I have discussed the findings and recommendations with the patient. If applicable, a reminder letter will be sent to the patient regarding the next appointment. BI-RADS CATEGORY  2: Benign. Electronically Signed   By: Hulan Saas M.D.   On: 10/09/2021 11:00  US BREAST LTD UNI RIGHT INC AXILLA  Result Date: 10/09/2021 CLINICAL DATA:  Recall from screening mammography, possible BILATERAL breast masses. EXAM: DIGITAL DIAGNOSTIC BILATERAL MAMMOGRAM WITH TOMOSYNTHESIS AND CAD; ULTRASOUND RIGHT BREAST LIMITED; ULTRASOUND LEFT BREAST LIMITED TECHNIQUE: Bilateral digital diagnostic mammography and breast tomosynthesis was performed. The images were evaluated with computer-aided detection.; Targeted ultrasound examination of the right breast was performed; Targeted ultrasound examination of the left breast was performed. COMPARISON:  Mammography  09/27/2021.  No prior ultrasound. ACR Breast Density Category c: The breast tissue is heterogeneously dense, which may obscure small masses. FINDINGS: Spot-compression CC and MLO views of the areas of concern in both breasts were obtained. RIGHT: Circumscribed isodense mass in the outer breast at anterior to middle depth measuring approximately 1 cm without  associated architectural distortion or suspicious calcifications. Circumscribed isodense mass versus focal duct ectasia involving the upper subareolar location. There are there is no associated architectural distortion or suspicious calcifications. Targeted ultrasound is performed, demonstrating an oval parallel anechoic mass with scattered internal echoes at the 9 o'clock position 5 cm from the nipple measuring approximately 1.1 x 0.7 x 0.8 cm, demonstrating posterior acoustic enhancement and no internal power Doppler flow, corresponding to the screening mammographic finding. Mild duct ectasia is present in the upper subareolar location at 12 o'clock. No dominant cyst, solid mass or abnormal acoustic shadowing is identified. LEFT: Circumscribed isodense mass in the UPPER INNER QUADRANT at middle depth measuring just over 1 cm in size without associated architectural distortion or suspicious calcifications. Targeted ultrasound is performed, demonstrating an oval parallel circumscribed anechoic mass with thin internal septations at the 10 o'clock position 6 cm from the nipple measuring approximately 1.3 x 1.1 x 1.1 cm, demonstrating posterior acoustic enhancement and no internal power Doppler flow, corresponding to the screening mammographic finding. There is a benign cyst immediately adjacent to this dominant cyst. No suspicious solid mass or abnormal acoustic shadowing is identified. IMPRESSION: Benign cysts in both breasts which account for the screening mammographic findings. RECOMMENDATION: Screening mammogram in one year.(Code:SM-B-01Y) I have discussed the findings and recommendations with the patient. If applicable, a reminder letter will be sent to the patient regarding the next appointment. BI-RADS CATEGORY  2: Benign. Electronically Signed   By: Hulan Saas M.D.   On: 10/09/2021 11:00   No results found.  No results found.    Assessment and Plan: Patient Active Problem List   Diagnosis Date  Noted   OSA on CPAP 01/06/2022   CPAP use counseling 01/06/2022   Pre-diabetes 08/15/2021   Symptomatic anemia 08/15/2021   Morbid obesity (HCC) 08/15/2021   Anxiety 03/20/2016   Major depressive disorder, recurrent, in partial remission (HCC) 03/20/2016   Hyperlipidemia, unspecified 03/20/2016    1. OSA on CPAP The patient does tolerate PAP and reports  benefit from PAP use. She puts it on most days, just does not always manage to wear it all night due to panic inside the mask. The patient was reminded how to clean equipment and advised to replace supplies routinely. The patient was also counselled on weight loss. The compliance is poor. The AHI is 0.1.   OSA on cpap- controlled Increase  compliance with pap. CPAP continues to be medically necessary to treat this patient's OSA. F/u one year.    2. CPAP use counseling CPAP Counseling: had a lengthy discussion with the patient regarding the importance of PAP therapy in management of the sleep apnea. Patient appears to understand the risk factor reduction and also understands the risks associated with untreated sleep apnea. Patient will try to make a good faith effort to remain compliant with therapy. Also instructed the patient on proper cleaning of the device including the water must be changed daily if possible and use of distilled water is preferred. Patient understands that the machine should be regularly cleaned with appropriate recommended cleaning solutions that do not damage the PAP machine for example given white vinegar and water rinses. Other  methods such as ozone treatment may not be as good as these simple methods to achieve cleaning.     General Counseling: I have discussed the findings of the evaluation and examination with Kasara.  I have also discussed any further diagnostic evaluation thatmay be needed or ordered today. Rose-Marie verbalizes understanding of the findings of todays visit. We also reviewed her medications today and  discussed drug interactions and side effects including but not limited excessive drowsiness and altered mental states. We also discussed that there is always a risk not just to her but also people around her. she has been encouraged to call the office with any questions or concerns that should arise related to todays visit.  No orders of the defined types were placed in this encounter.       I have personally obtained a history, examined the patient, evaluated laboratory and imaging results, formulated the assessment and plan and placed orders. This patient was seen today by Emmaline Kluver, PA-C in collaboration with Dr. Freda Munro.   Yevonne Pax, MD [facility] Diplomate ABMS Pulmonary Critical Care Medicine and Sleep Medicine

## 2023-07-06 ENCOUNTER — Ambulatory Visit (INDEPENDENT_AMBULATORY_CARE_PROVIDER_SITE_OTHER): Payer: BC Managed Care – PPO | Admitting: Internal Medicine

## 2023-07-06 VITALS — BP 117/79 | HR 88 | Resp 18 | Ht 62.0 in | Wt 238.6 lb

## 2023-07-06 DIAGNOSIS — G4733 Obstructive sleep apnea (adult) (pediatric): Secondary | ICD-10-CM | POA: Diagnosis not present

## 2023-07-06 DIAGNOSIS — Z7189 Other specified counseling: Secondary | ICD-10-CM

## 2023-07-06 NOTE — Patient Instructions (Signed)

## 2023-07-25 IMAGING — MG DIGITAL DIAGNOSTIC BILAT W/ TOMO W/ CAD
8 series · 8 of 24 positions shown · non-contrast
Comparison: Mammography 09/27/2021.

CLINICAL DATA: Recall from screening mammography, possible
BILATERAL breast masses.



[L CC synth-2D]
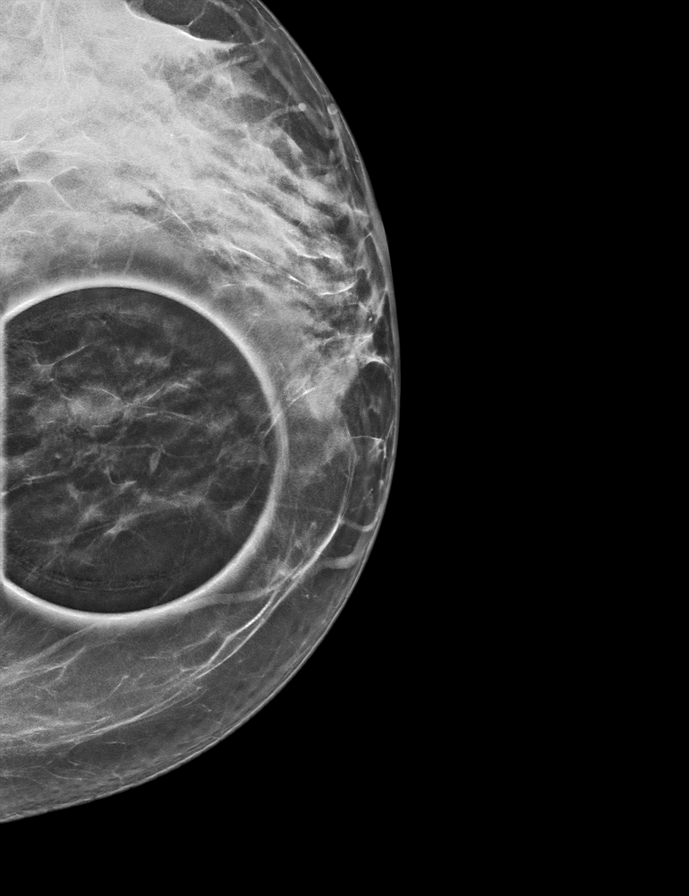

[R CC synth-2D]
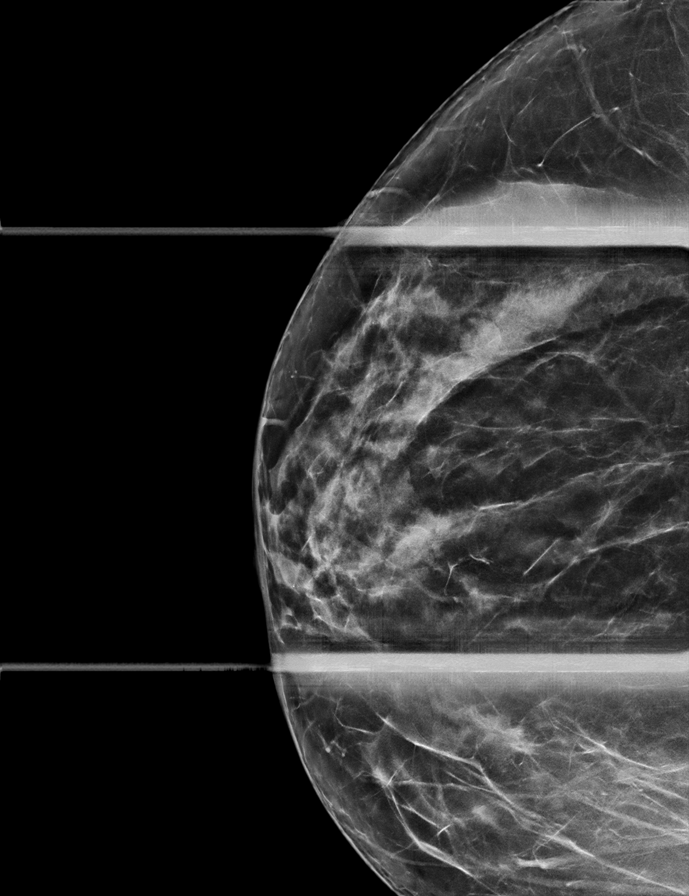

[R MLO synth-2D]
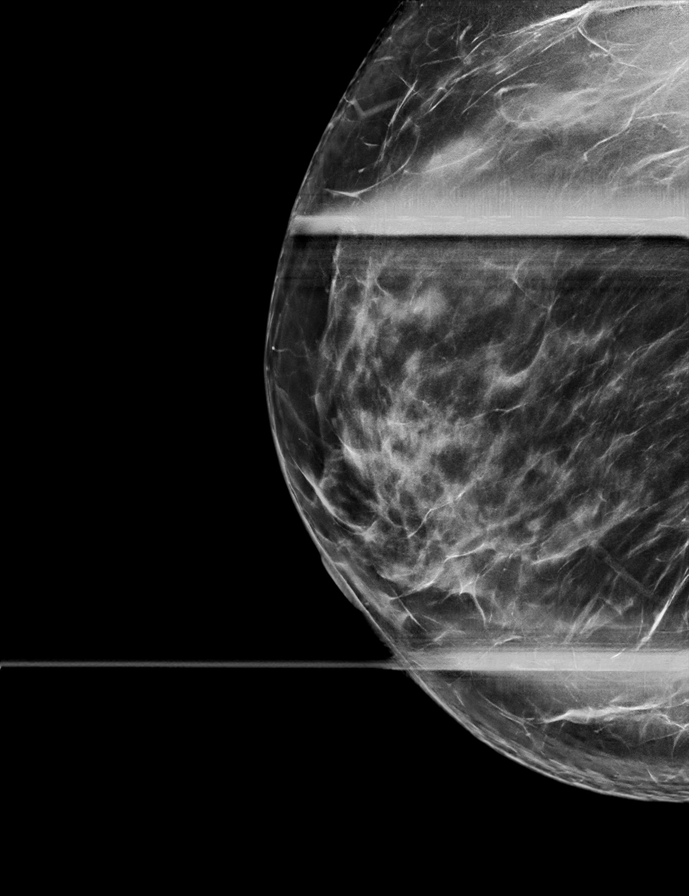

[L MLO synth-2D]
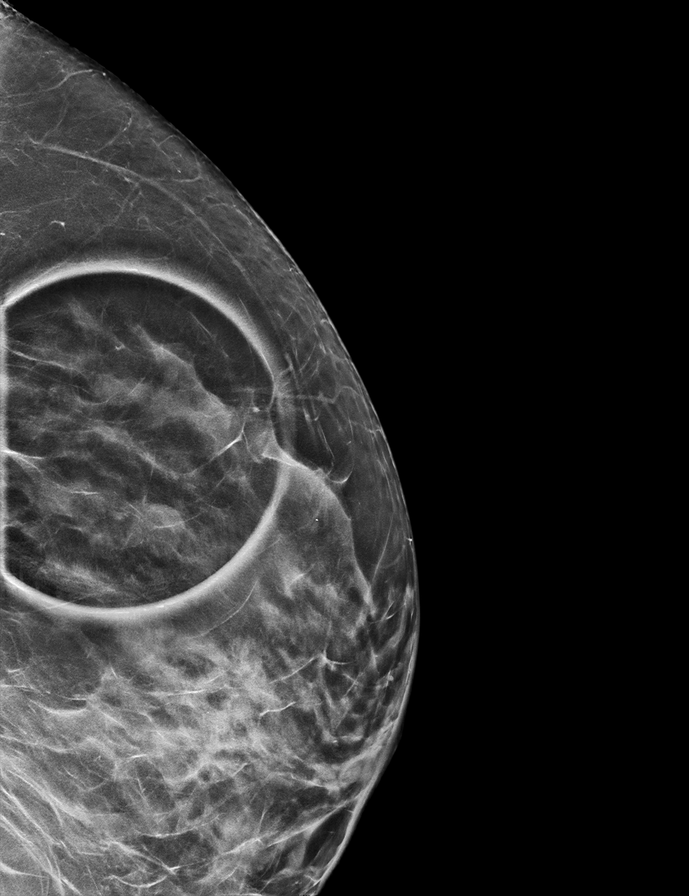

[L CC tomo · tomo slice 27/53.0]
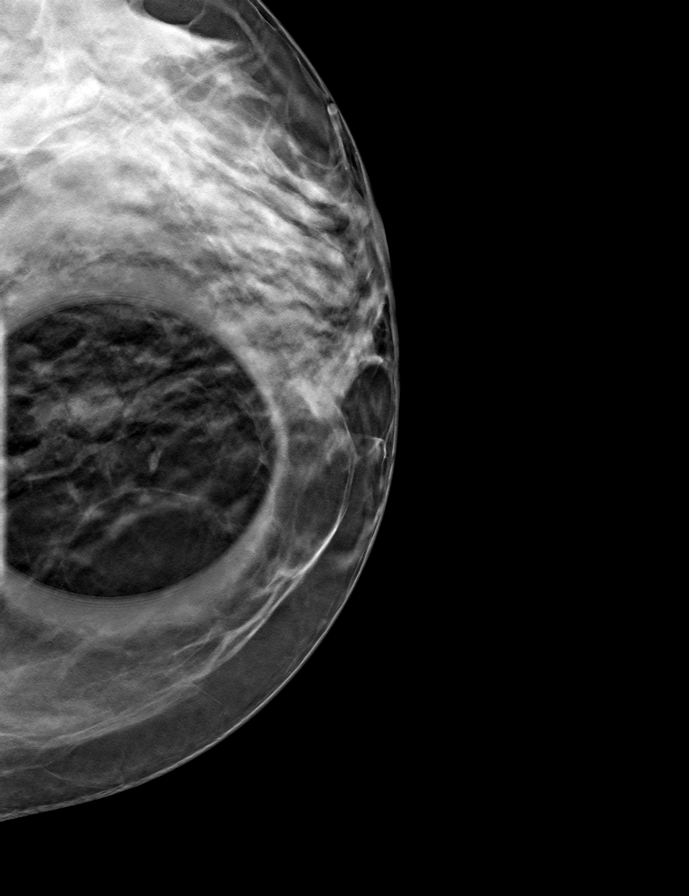

[R MLO tomo · tomo slice 42/83.0]
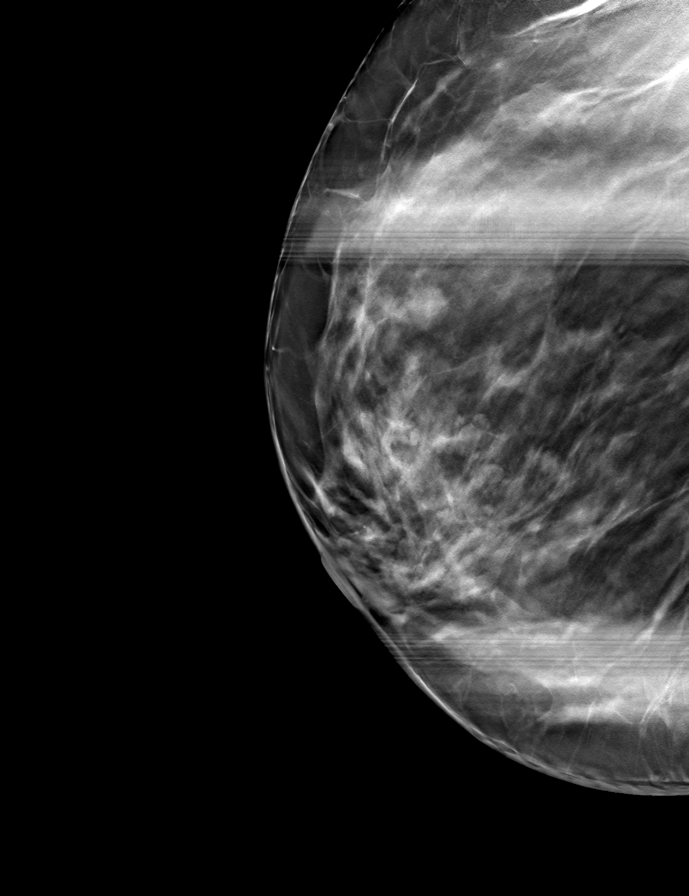

[L MLO tomo · tomo slice 35/68.0]
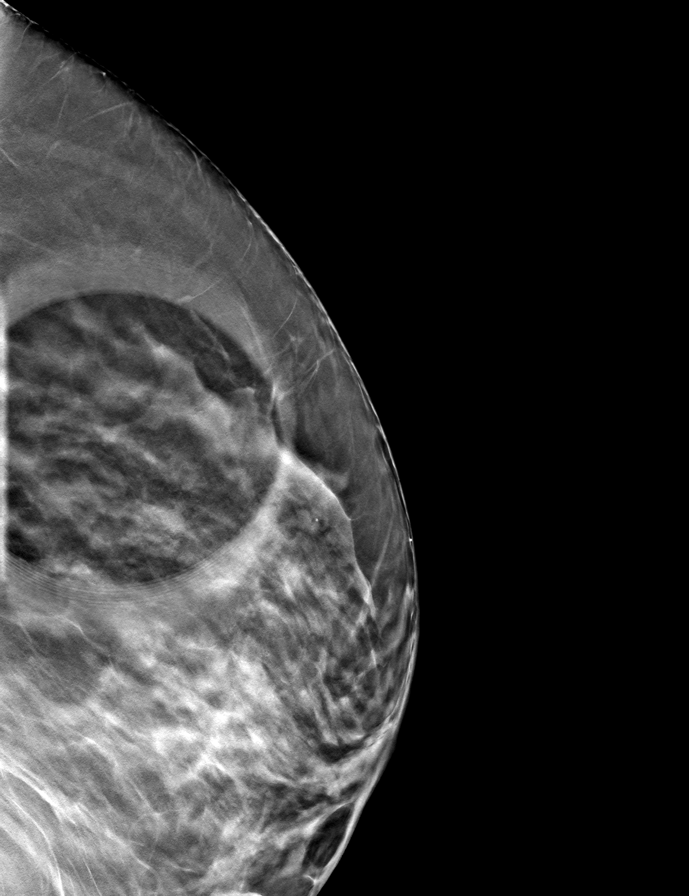

[R CC tomo · tomo slice 35/70.0]
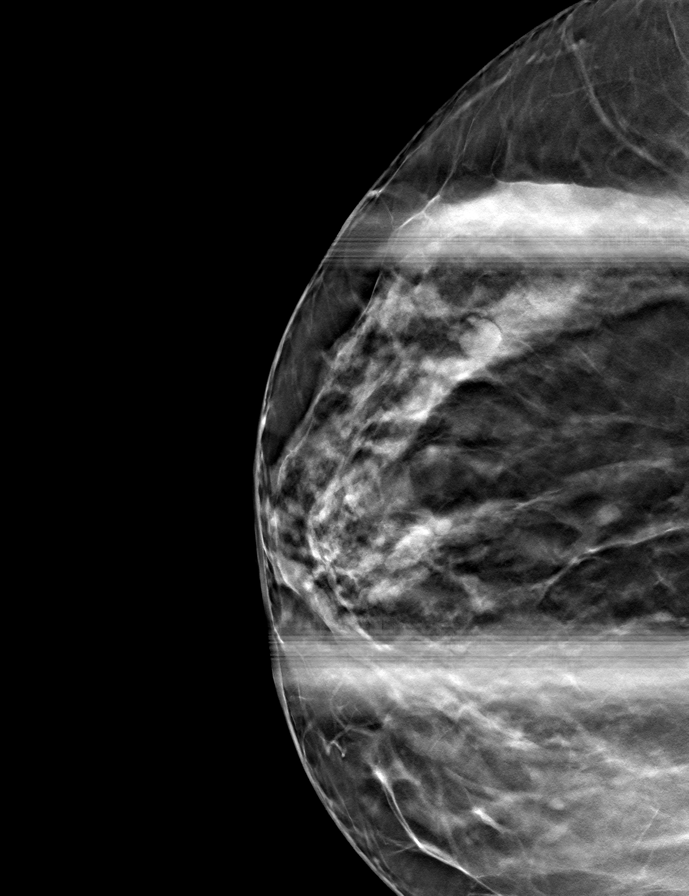

[8 of 24 positions shown; findings below may reference images not displayed]

No prior ultrasound.

ACR Breast Density Category c: The breast tissue is heterogeneously
dense, which may obscure small masses.
FINDINGS: Spot-compression CC and MLO views of the areas of concern in both
breasts were obtained.

RIGHT: Circumscribed isodense mass in the outer breast at anterior
to middle depth measuring approximately 1 cm without associated
architectural distortion or suspicious calcifications.

Circumscribed isodense mass versus focal duct ectasia involving the
upper subareolar location. There are there is no associated
architectural distortion or suspicious calcifications.

Targeted ultrasound is performed, demonstrating an oval parallel
anechoic mass with scattered internal echoes at the 9 o'clock
position 5 cm from the nipple measuring approximately 1.1 x 0.7 x
0.8 cm, demonstrating posterior acoustic enhancement and no internal
power Doppler flow, corresponding to the screening mammographic
finding.

Mild duct ectasia is present in the upper subareolar location at 12
o'clock. No dominant cyst, solid mass or abnormal acoustic shadowing
is identified.

LEFT: Circumscribed isodense mass in the UPPER INNER QUADRANT at
middle depth measuring just over 1 cm in size without associated
architectural distortion or suspicious calcifications.

Targeted ultrasound is performed, demonstrating an oval parallel
circumscribed anechoic mass with thin internal septations at the 10
o'clock position 6 cm from the nipple measuring approximately 1.3 x
1.1 x 1.1 cm, demonstrating posterior acoustic enhancement and no
internal power Doppler flow, corresponding to the screening
mammographic finding. There is a benign cyst immediately adjacent to
this dominant cyst. No suspicious solid mass or abnormal acoustic
shadowing is identified.
IMPRESSION: Benign cysts in both breasts which account for the screening
mammographic findings.

RECOMMENDATION:
Screening mammogram in one year.(Code:CN-[id number])

I have discussed the findings and recommendations with the patient.
If applicable, a reminder letter will be sent to the patient
regarding the next appointment.

BI-RADS CATEGORY  2: Benign.

## 2023-08-20 ENCOUNTER — Other Ambulatory Visit: Payer: Self-pay | Admitting: Family Medicine

## 2023-08-20 DIAGNOSIS — F5101 Primary insomnia: Secondary | ICD-10-CM

## 2023-08-24 NOTE — Telephone Encounter (Signed)
 Requested medication (s) are due for refill today yes  Requested medication (s) are on the active medication list -yes  Future visit scheduled -no  Last refill: 02/09/33 #30 5RF  Notes to clinic: non delegated Rx  Requested Prescriptions  Pending Prescriptions Disp Refills   zolpidem  (AMBIEN  CR) 6.25 MG CR tablet [Pharmacy Med Name: ZOLPIDEM  TART ER 6.25 MG TAB] 30 tablet     Sig: TAKE 1 TABLET BY MOUTH AT BEDTIME AS NEEDED FOR SLEEP.     Not Delegated - Psychiatry:  Anxiolytics/Hypnotics Failed - 08/24/2023  9:40 AM      Failed - This refill cannot be delegated      Failed - Urine Drug Screen completed in last 360 days      Failed - Valid encounter within last 6 months    Recent Outpatient Visits           6 months ago Morbid obesity [facility])   [place] [facility] [place], Marsa PARAS, DO   9 months ago Annual physical exam   [place] [facility] [place] Marsa PARAS, DO   1 year ago Morbid obesity [facility])   [place] [facility] [place] Marsa PARAS, DO   1 year ago Morbid obesity [facility])   [facility] [place] Marsa PARAS, DO   1 year ago Morbid obesity [facility])   [place] [facility] [place] Marsa PARAS, DO                 Requested Prescriptions  Pending Prescriptions Disp Refills   zolpidem  (AMBIEN  CR) 6.25 MG CR tablet [Pharmacy Med Name: ZOLPIDEM  TART ER 6.25 MG TAB] 30 tablet     Sig: TAKE 1 TABLET BY MOUTH AT BEDTIME AS NEEDED FOR SLEEP.     Not Delegated - Psychiatry:  Anxiolytics/Hypnotics Failed - 08/24/2023  9:40 AM      Failed - This refill cannot be delegated      Failed - Urine Drug Screen completed in last 360 days      Failed - Valid encounter within last 6 months    Recent Outpatient Visits           6 months ago Morbid obesity [facility])   [place] [facility] [place], Marsa PARAS, DO   9 months ago Annual  physical exam   [facility] [facility] [place] Marsa PARAS, DO   1 year ago Morbid obesity [facility])   [place] [facility] [place] Marsa PARAS, DO   1 year ago Morbid obesity [facility])   [place] [facility] [place] Marsa PARAS, DO   1 year ago Morbid obesity [facility])    [facility] [place], Marsa PARAS, [place]

## 2023-10-06 ENCOUNTER — Ambulatory Visit: Payer: BC Managed Care – PPO | Admitting: Family Medicine

## 2023-10-09 ENCOUNTER — Encounter: Payer: Self-pay | Admitting: Family Medicine

## 2023-10-09 ENCOUNTER — Ambulatory Visit: Payer: BC Managed Care – PPO | Admitting: Family Medicine

## 2023-10-09 DIAGNOSIS — R7303 Prediabetes: Secondary | ICD-10-CM | POA: Diagnosis not present

## 2023-10-09 DIAGNOSIS — M7581 Other shoulder lesions, right shoulder: Secondary | ICD-10-CM | POA: Diagnosis not present

## 2023-10-09 DIAGNOSIS — E782 Mixed hyperlipidemia: Secondary | ICD-10-CM | POA: Diagnosis not present

## 2023-10-09 MED ORDER — BACLOFEN 10 MG PO TABS
5.0000 mg | ORAL_TABLET | Freq: Three times a day (TID) | ORAL | 2 refills | Status: DC | PRN
Start: 2023-10-09 — End: 2024-05-18

## 2023-10-09 MED ORDER — NAPROXEN 500 MG PO TABS
500.0000 mg | ORAL_TABLET | Freq: Two times a day (BID) | ORAL | 2 refills | Status: DC
Start: 2023-10-09 — End: 2023-11-18

## 2023-10-09 MED ORDER — ZEPBOUND 2.5 MG/0.5ML ~~LOC~~ SOAJ
2.5000 mg | SUBCUTANEOUS | 0 refills | Status: DC
Start: 2023-10-09 — End: 2023-11-18

## 2023-10-09 NOTE — Patient Instructions (Addendum)
Thank you for coming to the office today.  Zepbound TO Check Cost & Coverage of Zepbound Please contact Research officer, trade union (manufacturer for [employer]) 1-800-LillyRx [phone]) - Live agent to discuss cost and coverage.   [email or web address] Verified [email or web address]     For Weight Loss / Obesity only   Contrave - oral medication, appetite suppression has wellbutrin/bupropion and naltrexone in it and it can also help with appetite, it is ordered through a speciality pharmacy. - $99 per month   Try 1 week sample Contrave - Week 1: 1 pill daily with meal - Week 2: 1 pill twice a day with meal - Week 3: 2 pill with meal in AM and 1 pill with PM meal - Week 4: 2 pill twice a day with meal   Please notify me after 1 week, and if doing well on Contrave, I can order to [facility] pharmacy $99 per month plan with this med.     Free sample 7 day, 1 pill per day for 1 week   Alternative options   Semaglutide injection (mixed Ozempic) from [facility] 0.25mg  weekly for 4 weeks then increase to 0.5mg  weekly It comes in a vial and a needle syringe, you need to draw up the shot and self admin it weekly Cost is about $200 per month Call them to check pricing and availability   [facility] Address: [address] [place], [place], [place] [address] Phone: [phone]   -----------------   [email or web address]   [email or web address]   Materials engineer Team With insurance $79/mo + copay for medications Without insurance $79/mo + $99/mo to include medication (Semaglutide) Without insurance $79/mo + $199/mo to include medication (Tirzepatide) [email or web address]  -----------  Most likely you have bursitis of your shoulder. This is inflammation of the shoulder joint caused most often by arthritis or wear and tear. Often it can flare up to cause bursitis due to repetitive activities or other triggers. It may  take time to heal, possibly 2 to 6 weeks, and it is important to avoid over use of shoulder especially above head motions that can re-aggravate the problem.  Recommend trial of Anti-inflammatory with Naproxen (Naprosyn) 500mg  tabs - take one with food and plenty of water TWICE daily every day (breakfast and dinner), for next 2 to 4 weeks, then you may take only as needed  - DO NOT TAKE any ibuprofen, aleve, motrin while you are taking this medicine  Recommend to start taking Tylenol Extra Strength 500mg  tabs - take 1 to 2 tabs per dose (max 1000mg ) every 6-8 hours for pain (take regularly, don't skip a dose for next 7 days), max 24 hour daily dose is 6 tablets or 3000mg . In the future you can repeat the same everyday Tylenol course for 1-2 weeks at a time.   If not improving we can do X-ray Also we can refer you to Physical Therapy if just need to improve on range of motion.  Please schedule a Follow-up Appointment to: Return if symptoms worsen or fail to improve.  If you have any other questions or concerns, please feel free to call the office or send a message through MyChart. You may also schedule an earlier appointment if necessary.  Additionally, you may be receiving a survey about your experience at our office within a few days to 1 week by e-mail or mail. We value your feedback.  Saralyn Pilar, DO [facility], [place]

## 2023-10-09 NOTE — Progress Notes (Signed)
Subjective:    Patient ID: Beth [name], female    DOB: [birth year], 53 y.o.   MRN: [id number]  Beth [name] is a 53 y.o. female presenting on 10/09/2023 for No chief complaint on file.   HPI  Discussed the use of AI scribe software for clinical note transcription with the patient, who gave verbal consent to proceed.  History of Present Illness   Beth [name] is a 53 year old female who presents for weight management and neck and shoulder pain.  Morbid Obesity BMI >43 Weight Management Previously on Wegovy, came off in 2023 when out of stock Tried Contrave, [place]  She is revisiting the topic of weight management. Previously, she considered starting Zepbound in June 2024 but did not proceed due to high out-of-pocket costs despite partial insurance coverage. Recently, she learned about a manufacturer savings card from a colleague, which could potentially reduce her costs. She has been working on lifestyle changes, including a home exercise program with a hula hoop and following a carnivore diet for over six months, which has stabilized her weight at 236 pounds. - Note past experience with Zepbound she got it approved by PA last year 05/2023 through 10/14/23 but has not picked up med yet  Right Shoulder / neck pain She has been experiencing pain in her neck and right shoulder for the past three to four weeks. The pain starts in her neck and radiates down to her shoulder, with a specific point of pain on the right shoulder. She has tried different pillows and adjusted her desk setup without relief. The pain is exacerbated by certain movements, although she can perform them, and she describes it as a persistent issue. She has been taking ibuprofen, three 200 mg tablets twice a day, but feels she is taking too much.       PMH Insomnia  She has taken Zolpidem CR 6.25mg  intermittently, not every night but does take it often next day to help improve her sleep.       10/09/2023   10:24 AM  02/10/2023    3:49 PM 11/18/2022   12:03 PM  Depression screen PHQ 2/9  Decreased Interest 3 1 1   Down, Depressed, Hopeless 2 1 1   PHQ - 2 Score 5 2 2   Altered sleeping 1 2   Tired, decreased energy 1 2   Change in appetite 0 1   Feeling bad or failure about yourself  2 1   Trouble concentrating 3 0   Moving slowly or fidgety/restless 1 0   Suicidal thoughts 2 1   PHQ-9 Score 15 9   Difficult doing work/chores Somewhat difficult Not difficult at all        10/09/2023   10:24 AM 02/10/2023    3:50 PM 11/05/2022    8:37 AM 06/30/2022    1:29 PM  GAD 7 : Generalized Anxiety Score  Nervous, Anxious, on Edge 2 2 2 1   Control/stop worrying 2 1 2 1   Worry too much - different things 2 2 2 1   Trouble relaxing 1 1 2 2   Restless 1 1 1 1   Easily annoyed or irritable 2 1 2 2   Afraid - awful might happen 1 0 1 1  Total GAD 7 Score 11 8 12 9   Anxiety Difficulty Not difficult at all Not difficult at all Somewhat difficult Very difficult    Social History   Tobacco Use   Smoking status: Never   Smokeless tobacco: Never  Vaping Use  Vaping status: Never Used  Substance Use Topics   Alcohol use: Yes    Alcohol/week: 2.0 standard drinks of alcohol    Types: 2 Standard drinks or equivalent per week    Comment: occasionally   Drug use: No    Review of Systems Per HPI unless specifically indicated above     Objective:    BP 122/78   Pulse (!) 104   Ht 5\' 2"  (1.575 m)   Wt 236 lb (107 kg)   SpO2 98%   BMI 43.16 kg/m   Wt Readings from Last 3 Encounters:  10/09/23 236 lb (107 kg)  07/06/23 238 lb 9.6 oz (108.2 kg)  05/18/23 236 lb 6.4 oz (107.2 kg)    Physical Exam Vitals and nursing note reviewed.  Constitutional:      General: She is not in acute distress.    Appearance: She is well-developed. She is obese. She is not diaphoretic.     Comments: Well-appearing, comfortable, cooperative  HENT:     Head: Normocephalic and atraumatic.  Eyes:     General:        Right  eye: No discharge.        Left eye: No discharge.     Conjunctiva/sclera: Conjunctivae normal.  Neck:     Thyroid: No thyromegaly.  Cardiovascular:     Rate and Rhythm: Normal rate and regular rhythm.     Heart sounds: Normal heart sounds. No murmur heard. Pulmonary:     Effort: Pulmonary effort is normal. No respiratory distress.     Breath sounds: Normal breath sounds. No wheezing or rales.  Musculoskeletal:        General: Normal range of motion.     Cervical back: Normal range of motion and neck supple.     Right lower leg: No edema.     Left lower leg: No edema.     Comments: Full range of motion Right shoulder forward flex mild discomfort with rotator cuff testing but strength intact without weakness. Impingement testing negative.  Lymphadenopathy:     Cervical: No cervical adenopathy.  Skin:    General: Skin is warm and dry.     Findings: No erythema or rash.  Neurological:     Mental Status: She is alert and oriented to person, place, and time.  Psychiatric:        Behavior: Behavior normal.     Comments: Well groomed, good eye contact, normal speech and thoughts     Results for orders placed or performed in visit on 05/18/23  VITAMIN D 25 Hydroxy (Vit-D Deficiency, Fractures)   Collection Time: 05/18/23 12:41 PM  Result Value Ref Range   Vit D, 25-Hydroxy 10.45 (L) 30 - 100 ng/mL  Lactate dehydrogenase   Collection Time: 05/18/23 12:41 PM  Result Value Ref Range   LDH 103 98 - 192 U/L  Ferritin   Collection Time: 05/18/23 12:41 PM  Result Value Ref Range   Ferritin 23 11 - 307 ng/mL  Iron and TIBC   Collection Time: 05/18/23 12:41 PM  Result Value Ref Range   Iron 56 28 - 170 ug/dL   TIBC 161 096 - 045 ug/dL   Saturation Ratios 13 10.4 - 31.8 %   UIBC 382 ug/dL  Basic metabolic panel   Collection Time: 05/18/23 12:41 PM  Result Value Ref Range   Sodium 136 135 - 145 mmol/L   Potassium 3.7 3.5 - 5.1 mmol/L   Chloride 105 98 - 111 mmol/L  CO2 24 22 - 32  mmol/L   Glucose, Bld 133 (H) 70 - 99 mg/dL   BUN 11 6 - 20 mg/dL   Creatinine, Ser 6.21 0.44 - 1.00 mg/dL   Calcium 9.2 8.9 - 30.8 mg/dL   GFR, Estimated >65 >78 mL/min   Anion gap 7 5 - 15  CBC with Differential (Cancer Center Only)   Collection Time: 05/18/23 12:41 PM  Result Value Ref Range   WBC Count 10.1 4.0 - 10.5 K/uL   RBC 4.90 3.87 - 5.11 MIL/uL   Hemoglobin 13.3 12.0 - 15.0 g/dL   HCT 46.9 62.9 - 52.8 %   MCV 80.6 80.0 - 100.0 fL   MCH 27.1 26.0 - 34.0 pg   MCHC 33.7 30.0 - 36.0 g/dL   RDW 41.3 24.4 - 01.0 %   Platelet Count 279 150 - 400 K/uL   nRBC 0.0 0.0 - 0.2 %   Neutrophils Relative % 60 %   Neutro Abs 6.1 1.7 - 7.7 K/uL   Lymphocytes Relative 30 %   Lymphs Abs 3.1 0.7 - 4.0 K/uL   Monocytes Relative 7 %   Monocytes Absolute 0.7 0.1 - 1.0 K/uL   Eosinophils Relative 1 %   Eosinophils Absolute 0.1 0.0 - 0.5 K/uL   Basophils Relative 1 %   Basophils Absolute 0.1 0.0 - 0.1 K/uL   Immature Granulocytes 1 %   Abs Immature Granulocytes 0.06 0.00 - 0.07 K/uL      Assessment & Plan:   Problem List Items Addressed This Visit     Hyperlipidemia, unspecified   Morbid obesity (HCC) - Primary   Relevant Medications   ZEPBOUND 2.5 MG/0.5ML Pen   Pre-diabetes   Other Visit Diagnoses       Rotator cuff tendonitis, right       Relevant Medications   naproxen (NAPROSYN) 500 MG tablet   baclofen (LIORESAL) 10 MG tablet        Morbid Obesity BMI >43 Stable weight without significant weight loss despite lifestyle modifications and carnivore diet., exercise regimen >6 months. Discussed the cost and insurance coverage of Zepbound and [address]. Info given on how to check coverage Also reviewed past history, seems she has had Zepbound approved 05/2023 through 10/14/23. It should be covered, she will check cost coverage, she should use copay discount card as well  New rx order Zepbound 2.5mg  weekly since currently active approval in place. She will pick up before  10/14/23  If needed due to high cost - See AVS. Consider Contrave oral, also compounded medicines if limited coverage -Check in 3 months to reauthorize medication if started.  R sided Neck and Shoulder Pain Pain in the neck and right shoulder for 3-4 weeks. Pain increases with certain movements and typing. No loss of range of motion. Possible tendonitis or bursitis. -Start Naproxen 500mg  twice daily with meals for 1-2 weeks. -Start Baclofen muscle relaxant 5-10mg  THREE TIMES A DAY AS NEEDED, caution sedation -Consider x-ray if no improvement in 6 weeks. -Consider physical therapy, massage, or chiropractor if no improvement with medication.  Follow-up in 3 months or sooner if issues arise.       No orders of the defined types were placed in this encounter.   Meds ordered this encounter  Medications   ZEPBOUND 2.5 MG/0.5ML Pen    Sig: Inject 2.5 mg into the skin once a week.    Dispense:  2 mL    Refill:  0   naproxen (NAPROSYN) 500 MG  tablet    Sig: Take 1 tablet (500 mg total) by mouth 2 (two) times daily with a meal. For 1-2 weeks then as needed    Dispense:  60 tablet    Refill:  2   baclofen (LIORESAL) 10 MG tablet    Sig: Take 0.5-1 tablets (5-10 mg total) by mouth 3 (three) times daily as needed for muscle spasms.    Dispense:  30 each    Refill:  2    Follow up plan: Return in about 3 months (around 01/06/2024), or if symptoms worsen or fail to improve.   Saralyn Pilar, DO [facility] 10/09/2023, 10:01 AM

## 2023-11-18 ENCOUNTER — Inpatient Hospital Stay (HOSPITAL_BASED_OUTPATIENT_CLINIC_OR_DEPARTMENT_OTHER): Payer: BC Managed Care – PPO | Admitting: Internal Medicine

## 2023-11-18 ENCOUNTER — Inpatient Hospital Stay: Payer: BC Managed Care – PPO | Attending: Internal Medicine

## 2023-11-18 ENCOUNTER — Inpatient Hospital Stay: Payer: BC Managed Care – PPO

## 2023-11-18 ENCOUNTER — Encounter: Payer: Self-pay | Admitting: Internal Medicine

## 2023-11-18 VITALS — BP 114/64 | HR 94 | Temp 98.7°F | Resp 20 | Wt 240.4 lb

## 2023-11-18 DIAGNOSIS — N920 Excessive and frequent menstruation with regular cycle: Secondary | ICD-10-CM | POA: Insufficient documentation

## 2023-11-18 DIAGNOSIS — R5383 Other fatigue: Secondary | ICD-10-CM | POA: Diagnosis not present

## 2023-11-18 DIAGNOSIS — R7989 Other specified abnormal findings of blood chemistry: Secondary | ICD-10-CM

## 2023-11-18 DIAGNOSIS — E559 Vitamin D deficiency, unspecified: Secondary | ICD-10-CM | POA: Insufficient documentation

## 2023-11-18 DIAGNOSIS — D649 Anemia, unspecified: Secondary | ICD-10-CM

## 2023-11-18 DIAGNOSIS — D500 Iron deficiency anemia secondary to blood loss (chronic): Secondary | ICD-10-CM | POA: Insufficient documentation

## 2023-11-18 LAB — CBC WITH DIFFERENTIAL (CANCER CENTER ONLY)
Abs Immature Granulocytes: 0.04 10*3/uL (ref 0.00–0.07)
Basophils Absolute: 0 10*3/uL (ref 0.0–0.1)
Basophils Relative: 0 %
Eosinophils Absolute: 0.1 10*3/uL (ref 0.0–0.5)
Eosinophils Relative: 2 %
HCT: 39.9 % (ref 36.0–46.0)
Hemoglobin: 13.2 g/dL (ref 12.0–15.0)
Immature Granulocytes: 0 %
Lymphocytes Relative: 33 %
Lymphs Abs: 3.1 10*3/uL (ref 0.7–4.0)
MCH: 26.8 pg (ref 26.0–34.0)
MCHC: 33.1 g/dL (ref 30.0–36.0)
MCV: 80.9 fL (ref 80.0–100.0)
Monocytes Absolute: 0.7 10*3/uL (ref 0.1–1.0)
Monocytes Relative: 8 %
Neutro Abs: 5.2 10*3/uL (ref 1.7–7.7)
Neutrophils Relative %: 57 %
Platelet Count: 308 10*3/uL (ref 150–400)
RBC: 4.93 MIL/uL (ref 3.87–5.11)
RDW: 14 % (ref 11.5–15.5)
WBC Count: 9.2 10*3/uL (ref 4.0–10.5)
nRBC: 0 % (ref 0.0–0.2)

## 2023-11-18 LAB — BASIC METABOLIC PANEL WITH GFR
Anion gap: 9 (ref 5–15)
BUN: 11 mg/dL (ref 6–20)
CO2: 23 mmol/L (ref 22–32)
Calcium: 9 mg/dL (ref 8.9–10.3)
Chloride: 103 mmol/L (ref 98–111)
Creatinine, Ser: 0.64 mg/dL (ref 0.44–1.00)
GFR, Estimated: >60 mL/min (ref >60)
Glucose, Bld: 123 mg/dL — ABNORMAL HIGH (ref 70–99)
Potassium: 3.7 mmol/L (ref 3.5–5.1)
Sodium: 135 mmol/L (ref 135–145)

## 2023-11-18 LAB — FERRITIN: Ferritin: 33 ng/mL (ref 11–307)

## 2023-11-18 LAB — IRON AND TIBC
Iron: 61 ug/dL (ref 28–170)
Saturation Ratios: 14 % (ref 10.4–31.8)
TIBC: 444 ug/dL (ref 250–450)
UIBC: 383 ug/dL

## 2023-11-18 LAB — VITAMIN D 25 HYDROXY (VIT D DEFICIENCY, FRACTURES): Vit D, 25-Hydroxy: 30.09 ng/mL (ref 30–100)

## 2023-11-18 LAB — LACTATE DEHYDROGENASE: LDH: 108 U/L (ref 98–192)

## 2023-11-18 NOTE — Progress Notes (Signed)
 Sleeping well, not requiring zolpidem. Energy is better. She had a period after 6 months of no periods, bleeding was her norm, on the heavy side. Denies. Had lightheadedness before her period which is her norm. Appetite is good. No blood in stool.

## 2023-11-18 NOTE — Assessment & Plan Note (Addendum)
#   Anemia- Hb-11.7- Likely due to iron deficiency - from etiology blood loss/menorrhagia [see below].  Iron studies March 2024-iron sat-5%; ferritin-9; severe iron deficiency. S/p IV venofer x3- no reactions.   # Today Hb 13.4; proceed with venofer today. Non-compliant with gentle iron [iron biglycinate; 28 mg ] 1 pill a day.    # Etiology of iron deficiency: Likely secondary to menorrhagia.  However, patient has not had screening colonoscopy.  Discussed re: referral to GI. [however patient declines, as she had a recent Cologuard negative].   # Menorrahagia- s/p gyn evaluation [Dr.Copeland].   # Vit D def -Ivin Poot 2024- 20s]-noncomplian with weekly Dose- reocmmend daily vit D3-3 2000 I U daily.   Will plan IV iron- if severe IDA # DISPOSITION: # HOLD off venofer today # follow up in 6 months MD; -labs- cbc;bmp; iron studies; ferritin- LDH levels- possible venofer;-Dr.B

## 2023-11-18 NOTE — Progress Notes (Signed)
 [facility] CONSULT NOTE  Patient Care Team: Smitty Cords, DO as PCP - General (Family Medicine) Earna Coder, MD as Consulting Physician (Internal Medicine)  CHIEF COMPLAINTS/PURPOSE OF CONSULTATION: ANEMIA   HEMATOLOGY HISTORY  # ANEMIA[Hb; MCV-platelets- WBC; Iron sat; ferritin;  GFR- CT/US- ;  EGD-none/colonoscopy- none [cologard- neg as per pt]   Latest Reference Range & Units 11/05/22 08:46  Iron 40 - 190 mcg/dL 26 (L)  TIBC 578 - 469 mcg/dL (calc) 629 (H)  %SAT 16 - 45 % (calc) 5 (L)  Ferritin 16 - 232 ng/mL 9 (L)  Alkaline phosphatase (APISO) 31 - 125 U/L 86  Vitamin D, 25-Hydroxy 30 - 100 ng/mL 13 (L)  (L): Data is abnormally low (H): Data is abnormally high  HISTORY OF PRESENTING ILLNESS: Patient ambulating-independently. Alone.   Beth [name] 53 y.o.  female pleasant patien iron deficiency anemia secondary to  menorrhagia is here for follow-up.  Patient Sleeping well, not requiring zolpidem. Energy is better. She had a period after 6 months of no periods, bleeding was her norm, on the heavy side. Denies. Had lightheadedness before her period which is her norm. Appetite is good. No blood in stool.     Review of Systems  Constitutional:  Positive for malaise/fatigue. Negative for chills, diaphoresis, fever and weight loss.  HENT:  Negative for nosebleeds and sore throat.   Eyes:  Negative for double vision.  Respiratory:  Negative for cough, hemoptysis, sputum production, shortness of breath and wheezing.   Cardiovascular:  Negative for chest pain, palpitations, orthopnea and leg swelling.  Gastrointestinal:  Negative for abdominal pain, blood in stool, constipation, diarrhea, heartburn, melena, nausea and vomiting.  Genitourinary:  Negative for dysuria, frequency and urgency.  Musculoskeletal:  Negative for back pain and joint pain.  Skin: Negative.  Negative for itching and rash.  Neurological:  Negative for dizziness,  tingling, focal weakness, weakness and headaches.  Endo/Heme/Allergies:  Does not bruise/bleed easily.  Psychiatric/Behavioral:  Negative for depression. The patient is not nervous/anxious and does not have insomnia.    MEDICAL HISTORY:  Past Medical History:  Diagnosis Date   Anemia    Anxiety    Depression    GERD (gastroesophageal reflux disease)    Kidney stone    kidney stone    SURGICAL HISTORY: Past Surgical History:  Procedure Laterality Date   HERNIA REPAIR  06/11/2021   TUBAL LIGATION      SOCIAL HISTORY: Social History   Socioeconomic History   Marital status: Divorced    Spouse name: Not on file   Number of children: 4   Years of education: Not on file   Highest education level: Associate degree: occupational, Scientist, product/process development, or vocational program  Occupational History   Not on file  Tobacco Use   Smoking status: Never   Smokeless tobacco: Never  Vaping Use   Vaping status: Never Used  Substance and Sexual Activity   Alcohol use: Yes    Alcohol/week: 2.0 standard drinks of alcohol    Types: 2 Standard drinks or equivalent per week    Comment: occasionally   Drug use: No   Sexual activity: Yes    Birth control/protection: None  Other Topics Concern   Not on file  Social History Narrative   Lives in [place]; via health care. With son/daughter. No smoking; rare alcohol.    Social Drivers of Health   Financial Resource Strain: Low Risk  (02/10/2023)   Overall Financial Resource Strain (CARDIA)  Difficulty of Paying Living Expenses: Not very hard  Recent Concern: Financial Resource Strain - Medium Risk (12/02/2022)   Overall Financial Resource Strain (CARDIA)    Difficulty of Paying Living Expenses: Somewhat hard  Food Insecurity: No Food Insecurity (02/10/2023)   Hunger Vital Sign    Worried About Running Out of Food in the Last Year: Never true    Ran Out of Food in the Last Year: Never true  Transportation Needs: No Transportation Needs  (02/10/2023)   PRAPARE - Administrator, Civil Service (Medical): No    Lack of Transportation (Non-Medical): No  Physical Activity: Inactive (02/10/2023)   Exercise Vital Sign    Days of Exercise per Week: 0 days    Minutes of Exercise per Session: 30 min  Stress: No Stress Concern Present (02/10/2023)   [employer] of Occupational Health - Occupational Stress Questionnaire    Feeling of Stress : Only a little  Recent Concern: Stress - Stress Concern Present (12/02/2022)   [employer] of Occupational Health - Occupational Stress Questionnaire    Feeling of Stress : To some extent  Social Connections: Socially Isolated (02/10/2023)   Social Connection and Isolation Panel [NHANES]    Frequency of Communication with Friends and Family: Once a week    Frequency of Social Gatherings with Friends and Family: Once a week    Attends Religious Services: Never    Database administrator or Organizations: No    Attends Engineer, structural: 1 to 4 times per year    Marital Status: Divorced  Catering manager Violence: Not At Risk (11/18/2022)   Humiliation, Afraid, Rape, and Kick questionnaire    Fear of Current or Ex-Partner: No    Emotionally Abused: No    Physically Abused: No    Sexually Abused: No    FAMILY HISTORY: Family History  Problem Relation Age of Onset   Diabetes Mother    Hypertension Mother    Diabetes Father    Hypertension Father    Cancer Paternal Uncle    Diabetes Maternal Grandmother    Heart disease Maternal Grandmother    Diabetes Maternal Grandfather    Diabetes Paternal Grandmother    Diabetes Paternal Grandfather     ALLERGIES:  has no known allergies.  MEDICATIONS:  Current Outpatient Medications  Medication Sig Dispense Refill   ergocalciferol (VITAMIN D2) 1.25 MG (50000 UT) capsule Take 1 capsule (50,000 Units total) by mouth once a week. 12 capsule 1   famotidine (PEPCID) 20 MG tablet Take 1 tablet (20 mg total) by mouth 2  (two) times daily. (Patient taking differently: Take 20 mg by mouth 2 (two) times daily. Pt reports taking PRN) 60 tablet 0   baclofen (LIORESAL) 10 MG tablet Take 0.5-1 tablets (5-10 mg total) by mouth 3 (three) times daily as needed for muscle spasms. (Patient not taking: Reported on 11/18/2023) 30 each 2   zolpidem (AMBIEN CR) 6.25 MG CR tablet TAKE 1 TABLET BY MOUTH AT BEDTIME AS NEEDED FOR SLEEP. (Patient not taking: Reported on 11/18/2023) 30 tablet 5   No current facility-administered medications for this visit.     PHYSICAL EXAMINATION:   Vitals:   11/18/23 1311  BP: 114/64  Pulse: 94  Resp: 20  Temp: 98.7 F (37.1 C)  SpO2: 97%   Filed Weights   11/18/23 1311  Weight: 240 lb 6.4 oz (109 kg)    Physical Exam Vitals and nursing note reviewed.  HENT:  Head: Normocephalic and atraumatic.     Mouth/Throat:     Pharynx: Oropharynx is clear.  Eyes:     Extraocular Movements: Extraocular movements intact.     Pupils: Pupils are equal, round, and reactive to light.  Cardiovascular:     Rate and Rhythm: Normal rate and regular rhythm.  Pulmonary:     Comments: Decreased breath sounds bilaterally.  Abdominal:     Palpations: Abdomen is soft.  Musculoskeletal:        General: Normal range of motion.     Cervical back: Normal range of motion.  Skin:    General: Skin is warm.  Neurological:     General: No focal deficit present.     Mental Status: She is alert and oriented to person, place, and time.  Psychiatric:        Behavior: Behavior normal.        Judgment: Judgment normal.      LABORATORY DATA:  I have reviewed the data as listed Lab Results  Component Value Date   WBC 9.2 11/18/2023   HGB 13.2 11/18/2023   HCT 39.9 11/18/2023   MCV 80.9 11/18/2023   PLT 308 11/18/2023   Recent Labs    01/16/23 0904 05/18/23 1241 11/18/23 1250  NA 137 136 135  K 4.0 3.7 3.7  CL 105 105 103  CO2 22 24 23   GLUCOSE 111* 133* 123*  BUN 14 11 11   CREATININE 0.69  0.59 0.64  CALCIUM 9.3 9.2 9.0  GFRNONAA >60 >60 >60     No results found.  ASSESSMENT & PLAN:   Symptomatic anemia # Anemia- Hb-11.7- Likely due to iron deficiency - from etiology blood loss/menorrhagia [see below].  Iron studies March 2024-iron sat-5%; ferritin-9; severe iron deficiency. S/p IV venofer x3- no reactions.   # Today Hb 13.4; proceed with venofer today. Non-compliant with gentle iron [iron biglycinate; 28 mg ] 1 pill a day.    # Etiology of iron deficiency: Likely secondary to menorrhagia.  However, patient has not had screening colonoscopy.  Discussed re: referral to GI. [however patient declines, as she had a recent Cologuard negative].   # Menorrahagia- s/p gyn evaluation [Dr.Copeland].   # Vit D def -Ivin Poot 2024- 20s]-noncomplian with weekly Dose- reocmmend daily vit D3-3 2000 I U daily.   Will plan IV iron- if severe IDA # DISPOSITION: # HOLD off venofer today # follow up in 6 months MD; -labs- cbc;bmp; iron studies; ferritin- LDH levels- possible venofer;-Dr.B   All questions were answered. The patient knows to call the clinic with any problems, questions or concerns.   Earna Coder, MD 11/18/2023 1:40 PM

## 2023-11-19 ENCOUNTER — Other Ambulatory Visit: Payer: Self-pay | Admitting: Internal Medicine

## 2023-11-20 ENCOUNTER — Encounter: Payer: Self-pay | Admitting: Internal Medicine

## 2024-02-22 ENCOUNTER — Other Ambulatory Visit (HOSPITAL_COMMUNITY): Payer: Self-pay

## 2024-02-22 ENCOUNTER — Encounter: Payer: Self-pay | Admitting: Internal Medicine

## 2024-02-22 ENCOUNTER — Telehealth: Payer: Self-pay

## 2024-02-22 NOTE — Telephone Encounter (Signed)
 Pharmacy Patient Advocate Encounter  Received notification from [facility] that Prior Authorization for Zepbound  has been APPROVED from 02/22/2024 to 08/20/2024

## 2024-02-22 NOTE — Telephone Encounter (Signed)
 Pharmacy Patient Advocate Encounter   Received notification from CoverMyMeds that prior authorization for Zepbound  2.5MG /0.5ML pen-injectors is required/requested.   Insurance verification completed.   The patient is insured through [facility] .   Per test claim: PA required; PA submitted to above mentioned insurance via CoverMyMeds Key/confirmation #/EOC [id number] Status is pending

## 2024-04-11 ENCOUNTER — Other Ambulatory Visit: Payer: Self-pay | Admitting: Family Medicine

## 2024-04-11 DIAGNOSIS — F5101 Primary insomnia: Secondary | ICD-10-CM

## 2024-04-12 NOTE — Telephone Encounter (Signed)
 Requested medications are due for refill today.  yes  Requested medications are on the active medications list.  yes  Last refill. 08/24/2023 #30 5 rf  Future visit scheduled.   no  Notes to clinic.  Refill not delegated.    Requested Prescriptions  Pending Prescriptions Disp Refills   zolpidem  (AMBIEN  CR) 6.25 MG CR tablet [Pharmacy Med Name: ZOLPIDEM  TART ER 6.25 MG TAB] 30 tablet     Sig: TAKE 1 TABLET BY MOUTH EVERY DAY AT BEDTIME AS NEEDED FOR SLEEP     Not Delegated - Psychiatry:  Anxiolytics/Hypnotics Failed - 04/12/2024  4:11 PM      Failed - This refill cannot be delegated      Failed - Urine Drug Screen completed in last 360 days      Failed - Valid encounter within last 6 months    Recent Outpatient Visits           6 months ago Morbid obesity [facility])   [facility] [place], [place], [place]

## 2024-05-18 ENCOUNTER — Encounter: Payer: Self-pay | Admitting: Internal Medicine

## 2024-05-18 ENCOUNTER — Inpatient Hospital Stay

## 2024-05-18 ENCOUNTER — Inpatient Hospital Stay: Admitting: Internal Medicine

## 2024-05-18 ENCOUNTER — Inpatient Hospital Stay: Attending: Internal Medicine

## 2024-05-18 VITALS — BP 121/84 | HR 85 | Temp 97.1°F | Resp 16 | Ht 62.0 in | Wt 246.8 lb

## 2024-05-18 VITALS — BP 125/83 | HR 88 | Temp 96.4°F | Resp 17

## 2024-05-18 DIAGNOSIS — D649 Anemia, unspecified: Secondary | ICD-10-CM

## 2024-05-18 DIAGNOSIS — D500 Iron deficiency anemia secondary to blood loss (chronic): Secondary | ICD-10-CM | POA: Diagnosis not present

## 2024-05-18 DIAGNOSIS — E559 Vitamin D deficiency, unspecified: Secondary | ICD-10-CM | POA: Insufficient documentation

## 2024-05-18 DIAGNOSIS — N920 Excessive and frequent menstruation with regular cycle: Secondary | ICD-10-CM | POA: Diagnosis not present

## 2024-05-18 DIAGNOSIS — R5383 Other fatigue: Secondary | ICD-10-CM | POA: Diagnosis not present

## 2024-05-18 LAB — CBC WITH DIFFERENTIAL (CANCER CENTER ONLY)
Abs Immature Granulocytes: 0.04 K/uL (ref 0.00–0.07)
Basophils Absolute: 0 K/uL (ref 0.0–0.1)
Basophils Relative: 0 %
Eosinophils Absolute: 0.2 K/uL (ref 0.0–0.5)
Eosinophils Relative: 2 %
HCT: 38.2 % (ref 36.0–46.0)
Hemoglobin: 12.7 g/dL (ref 12.0–15.0)
Immature Granulocytes: 0 %
Lymphocytes Relative: 30 %
Lymphs Abs: 2.8 K/uL (ref 0.7–4.0)
MCH: 26.3 pg (ref 26.0–34.0)
MCHC: 33.2 g/dL (ref 30.0–36.0)
MCV: 79.1 fL — ABNORMAL LOW (ref 80.0–100.0)
Monocytes Absolute: 0.7 K/uL (ref 0.1–1.0)
Monocytes Relative: 7 %
Neutro Abs: 5.6 K/uL (ref 1.7–7.7)
Neutrophils Relative %: 61 %
Platelet Count: 311 K/uL (ref 150–400)
RBC: 4.83 MIL/uL (ref 3.87–5.11)
RDW: 14.6 % (ref 11.5–15.5)
WBC Count: 9.3 K/uL (ref 4.0–10.5)
nRBC: 0 % (ref 0.0–0.2)

## 2024-05-18 LAB — BASIC METABOLIC PANEL - CANCER CENTER ONLY
Anion gap: 7 (ref 5–15)
BUN: 9 mg/dL (ref 6–20)
CO2: 24 mmol/L (ref 22–32)
Calcium: 9 mg/dL (ref 8.9–10.3)
Chloride: 105 mmol/L (ref 98–111)
Creatinine: 0.6 mg/dL (ref 0.44–1.00)
GFR, Estimated: >60 mL/min (ref >60)
Glucose, Bld: 113 mg/dL — ABNORMAL HIGH (ref 70–99)
Potassium: 3.6 mmol/L (ref 3.5–5.1)
Sodium: 136 mmol/L (ref 135–145)

## 2024-05-18 LAB — IRON AND TIBC
Iron: 44 ug/dL (ref 28–170)
Saturation Ratios: 10 % — ABNORMAL LOW (ref 10.4–31.8)
TIBC: 448 ug/dL (ref 250–450)
UIBC: 404 ug/dL

## 2024-05-18 LAB — FERRITIN: Ferritin: 23 ng/mL (ref 11–307)

## 2024-05-18 LAB — LACTATE DEHYDROGENASE: LDH: 115 U/L (ref 98–192)

## 2024-05-18 MED ORDER — IRON SUCROSE 20 MG/ML IV SOLN
200.0000 mg | Freq: Once | INTRAVENOUS | Status: AC
  Administered 2024-05-18: 200 mg via INTRAVENOUS
  Filled 2024-05-18: qty 10

## 2024-05-18 MED ORDER — SODIUM CHLORIDE 0.9% FLUSH
10.0000 mL | Freq: Once | INTRAVENOUS | Status: AC | PRN
  Administered 2024-05-18: 10 mL
  Filled 2024-05-18: qty 10

## 2024-05-18 NOTE — Assessment & Plan Note (Addendum)
#   Anemia- Hb-11.7- Likely due to iron  deficiency - from etiology blood loss/menorrhagia [see below].  Iron  studies March 2024-iron  sat-5%; ferritin-9; severe iron  deficiency. S/p IV venofer  x3- no reactions.   # Today Hb 12.7- ; proceed with venofer  today. Again recommend compliance with gentle iron  [iron  biglycinate; 28 mg-no constipation] 1 pill a day.    # Etiology of iron  deficiency: Likely secondary to menorrhagia.  However, patient has not had screening colonoscopy.  Discussed re: referral to GI. [however patient declines, as she had a recent Cologuard negative].   # Menorrahagia- s/p gyn evaluation [Dr.Copeland].   # Vit D def -ernestina 2024- 20s]-noncomplian with weekly Dose- reocmmend daily vit D3-3 2000 I U daily.   Will plan IV iron - if severe IDA # DISPOSITION: # venofer  today # follow up in 6 months MD; -labs- cbc;bmp; iron  studies; ferritin- LDH levels- possible venofer ;-Dr.B

## 2024-05-18 NOTE — Progress Notes (Signed)
 Fatigue/weakness: YES WITH MENSES Dyspena: NO Light headedness: YES WITH MENSES  Blood in stool: NO

## 2024-05-18 NOTE — Progress Notes (Signed)
 This Beth [name]  Patient Care Team: Beth [name], Beth [name] as PCP - General (Family Medicine) Beth Beth [name], Beth [name] as Consulting Physician (Internal Medicine)  CHIEF COMPLAINTS/PURPOSE OF CONSULTATION: ANEMIA   HEMATOLOGY HISTORY  # ANEMIA[Hb; MCV-platelets- WBC; Iron  sat; ferritin;  GFR- CT/US - ;  EGD-none/colonoscopy- none [cologard- neg as per pt]   Latest Reference Range & Units 11/05/22 08:46  Iron  40 - 190 mcg/dL 26 (L)  TIBC 749 - 549 mcg/dL (calc) 490 (H)  %SAT 16 - 45 % (calc) 5 (L)  Ferritin 16 - 232 ng/mL 9 (L)  Alkaline phosphatase (APISO) 31 - 125 U/L 86  Vitamin D , 25-Hydroxy 30 - 100 ng/mL 13 (L)  (L): Data is abnormally low (H): Data is abnormally high  HISTORY OF PRESENTING ILLNESS: Patient ambulating-independently. Alone.   Beth [name] pleasant patien iron  deficiency anemia secondary to menorrhagia is here for follow-up.  Patient endorses intermittent heavy menstrual cycles.  Denies any blood in stools.  Noncompliant with her oral iron .  Appetite is good. No blood in stool.    Review of Systems  Constitutional:  Positive for malaise/fatigue. Negative for chills, diaphoresis, fever and weight loss.  HENT:  Negative for nosebleeds and sore throat.   Eyes:  Negative for double vision.  Respiratory:  Negative for cough, hemoptysis, sputum production, shortness of breath and wheezing.   Cardiovascular:  Negative for chest pain, palpitations, orthopnea and leg swelling.  Gastrointestinal:  Negative for abdominal pain, blood in stool, constipation, diarrhea, heartburn, melena, nausea and vomiting.  Genitourinary:  Negative for dysuria, frequency and urgency.  Musculoskeletal:  Negative for back pain and joint pain.  Skin: Negative.  Negative for itching and rash.  Neurological:  Negative for dizziness, tingling, focal weakness, weakness and headaches.  Endo/Heme/Allergies:  Does not bruise/bleed easily.   Psychiatric/Behavioral:  Negative for depression. The patient is not nervous/anxious and does not have insomnia.    MEDICAL HISTORY:  Past Medical History:  Diagnosis Date   Anemia    Anxiety    Depression    GERD (gastroesophageal reflux disease)    Kidney stone    kidney stone    SURGICAL HISTORY: Past Surgical History:  Procedure Laterality Date   HERNIA REPAIR  06/11/2021   TUBAL LIGATION      SOCIAL HISTORY: Social History   Socioeconomic History   Marital status: Divorced    Spouse name: Not on file   Number of children: 4   Years of education: Not on file   Highest education level: Associate degree: occupational, Scientist, product/process development, or vocational program  Occupational History   Not on file  Tobacco Use   Smoking status: Never   Smokeless tobacco: Never  Vaping Use   Vaping status: Never Used  Substance and Sexual Activity   Alcohol use: Yes    Alcohol/week: 2.0 standard drinks of alcohol    Types: 2 Standard drinks or equivalent per week    Comment: occasionally   Drug use: No   Sexual activity: Yes    Birth control/protection: None  Other Topics Concern   Not on file  Social History Narrative   Lives in [place]; via health care. With son/daughter. No smoking; rare alcohol.    Social Drivers of Health   Financial Resource Strain: Low Risk  (6/25/[name])   Overall Financial Resource Strain (CARDIA)    Difficulty of Paying Living Expenses: Not very hard  Recent Concern: Financial Resource Strain - Medium Risk (4/16/[name])  Overall Financial Resource Strain (CARDIA)    Difficulty of Paying Living Expenses: Somewhat hard  Food Insecurity: No Food Insecurity (6/25/[name])   Hunger Vital Sign    Worried About Running Out of Food in the Last Year: Never true    Ran Out of Food in the Last Year: Never true  Transportation Needs: No Transportation Needs (6/25/[name])   PRAPARE - Administrator, Civil Service (Medical): No    Lack of Transportation  (Non-Medical): No  Physical Activity: Unknown (6/25/[name])   Exercise Vital Sign    Days of Exercise per Week: 0 days    Minutes of Exercise per Session: Not on file  Recent Concern: Physical Activity - Inactive (6/25/[name])   Exercise Vital Sign    Days of Exercise per Week: 0 days    Minutes of Exercise per Session: 30 min  Stress: No Stress Concern Present (6/25/[name])   [employer] of Occupational Health - Occupational Stress Questionnaire    Feeling of Stress : Only a little  Recent Concern: Stress - Stress Concern Present (4/16/[name])   [employer] of Occupational Health - Occupational Stress Questionnaire    Feeling of Stress : To some extent  Social Connections: Socially Isolated (6/25/[name])   Social Connection and Isolation Panel    Frequency of Communication with Friends and Family: Once a week    Frequency of Social Gatherings with Friends and Family: Once a week    Attends Religious Services: Never    Database administrator or Organizations: No    Attends Engineer, structural: Not on file    Marital Status: Divorced  Intimate Partner Violence: Not At Risk (4/2/[name])   Humiliation, Afraid, Rape, and Kick questionnaire    Fear of Current or Ex-Partner: No    Emotionally Abused: No    Physically Abused: No    Sexually Abused: No    FAMILY HISTORY: Family History  Problem Relation Age of Onset   Diabetes Mother    Hypertension Mother    Diabetes Father    Hypertension Father    Cancer Paternal Uncle    Diabetes Maternal Grandmother    Heart disease Maternal Grandmother    Diabetes Maternal Grandfather    Diabetes Paternal Grandmother    Diabetes Paternal Grandfather     ALLERGIES:  has no known allergies.  MEDICATIONS:  Current Outpatient Medications  Medication Sig Dispense Refill   famotidine  (PEPCID ) 20 MG tablet Take 1 tablet (20 mg total) by mouth 2 (two) times daily. (Patient taking differently: Take 20 mg by mouth 2 (two) times daily.  Pt reports taking PRN) 60 tablet 0   Vitamin D , Ergocalciferol , (DRISDOL ) 1.25 MG (50000 UNIT) CAPS capsule TAKE 1 CAPSULE BY MOUTH ONE TIME PER WEEK 12 capsule 1   zolpidem  (AMBIEN  CR) 6.25 MG CR tablet TAKE 1 TABLET BY MOUTH EVERY DAY AT BEDTIME AS NEEDED FOR SLEEP 30 tablet 2   No current facility-administered medications for this visit.     PHYSICAL EXAMINATION:   Vitals:   05/18/24 1320  BP: 121/84  Pulse: 85  Resp: 16  Temp: (!) 97.1 F (36.2 C)  SpO2: 98%   Filed Weights   05/18/24 1320  Weight: 246 lb 12.8 oz (111.9 kg)    Physical Exam Vitals and nursing [name] reviewed.  HENT:     Head: Normocephalic and atraumatic.     Mouth/Throat:     Pharynx: Oropharynx is clear.  Eyes:     Extraocular Movements: Extraocular  movements intact.     Pupils: Pupils are equal, round, and reactive to light.  Cardiovascular:     Rate and Rhythm: Normal rate and regular rhythm.  Pulmonary:     Comments: Decreased breath sounds bilaterally.  Abdominal:     Palpations: Abdomen is soft.  Musculoskeletal:        General: Normal range of motion.     Cervical back: Normal range of motion.  Skin:    General: Skin is warm.  Neurological:     General: No focal deficit present.     Mental Status: She is alert and oriented to person, place, and time.  Psychiatric:        Behavior: Behavior normal.        Judgment: Judgment normal.      LABORATORY DATA:  I have reviewed the data as listed Lab Results  Component Value Date   WBC 9.3 05/18/2024   HGB 12.7 05/18/2024   HCT 38.2 05/18/2024   MCV 79.1 (L) 05/18/2024   PLT 311 05/18/2024   Recent Labs    11/18/23 1250 05/18/24 1327  NA 135 136  K 3.7 3.6  CL 103 105  CO2 23 24  GLUCOSE 123* 113*  BUN 11 9  CREATININE 0.64 0.60  CALCIUM 9.0 9.0  GFRNONAA >60 >60     No results found.  ASSESSMENT & PLAN:   Symptomatic anemia # Anemia- Hb-11.7- Likely due to iron  deficiency - from etiology blood loss/menorrhagia  [see below].  Iron  studies March [name]-iron  sat-5%; ferritin-9; severe iron  deficiency. S/p IV venofer  x3- no reactions.   # Today Hb 12.7- ; proceed with venofer  today. Again recommend compliance with gentle iron  [iron  biglycinate; 28 mg-no constipation] 1 pill a day.    # Etiology of iron  deficiency: Likely secondary to menorrhagia.  However, patient has not had screening colonoscopy.  Discussed re: referral to GI. [however patient declines, as she had a recent Cologuard negative].   # Menorrahagia- s/p gyn evaluation [Beth [name]].   # Vit D def -Beth [name]- 20s]-noncomplian with weekly Dose- reocmmend daily vit D3-3 2000 I U daily.   Will plan IV iron - if severe IDA # DISPOSITION: # venofer  today # follow up in 6 months Beth [name]; -labs- cbc;bmp; iron  studies; ferritin- LDH levels- possible venofer ;-Dr.B   All questions were answered. The patient knows to call the clinic with any problems, questions or concerns.   Beth JONELLE Joe, Beth [name] 05/18/2024 2:06 PM

## 2024-07-08 NOTE — Progress Notes (Unsigned)
 [facility] [address] [place], [place] [address]  Pulmonary Sleep Medicine   Office Visit Note  Patient Name: Beth [name] DOB: 09/20/1970 MRN [id number]    Chief Complaint: Obstructive Sleep Apnea visit  Brief History:  Beth [name] is seen today for an annual follow up visit for CPAP@ 8 cmH2O. The patient has a 3 year history of sleep apnea. Patient is using PAP nightly.  The patient feels rested after sleeping with PAP.  The patient reports benefiting from PAP use. Reported sleepiness is  improved and the Epworth Sleepiness Score is 12 out of 24. The patient will rarely take naps. The patient complains of the following: pt is in need of replacement supplies.  The compliance download shows 70% compliance with an average use time of 5 hours 24 minutes. The AHI is 0.3.  The patient does not complain of limb movements disrupting sleep. The patient continues to require PAP therapy in order to eliminate sleep apnea. She has been dealing with hot flashes.   ROS  General: (-) fever, (-) chills, (-) night sweat Nose and Sinuses: (-) nasal stuffiness or itchiness, (-) postnasal drip, (-) nosebleeds, (-) sinus trouble. Mouth and Throat: (-) sore throat, (-) hoarseness. Neck: (-) swollen glands, (-) enlarged thyroid , (-) neck pain. Respiratory: - cough, - shortness of breath, - wheezing. Neurologic: - numbness, - tingling. Psychiatric: + anxiety, - depression   Current Medication: Outpatient Encounter Medications as of 07/11/2024  Medication Sig   famotidine  (PEPCID ) 20 MG tablet Take 1 tablet (20 mg total) by mouth 2 (two) times daily. (Patient taking differently: Take 20 mg by mouth 2 (two) times daily. Pt reports taking PRN)   Vitamin D , Ergocalciferol , (DRISDOL ) 1.25 MG (50000 UNIT) CAPS capsule TAKE 1 CAPSULE BY MOUTH ONE TIME PER WEEK   zolpidem  (AMBIEN  CR) 6.25 MG CR tablet TAKE 1 TABLET BY MOUTH EVERY DAY AT BEDTIME AS NEEDED FOR SLEEP   [DISCONTINUED] albuterol   (PROVENTIL  HFA;VENTOLIN  HFA) 108 (90 Base) MCG/ACT inhaler Inhale 2 puffs into the lungs every 6 (six) hours as needed for wheezing or shortness of breath.   [DISCONTINUED] clonazePAM (KLONOPIN) 0.5 MG tablet Take 1 tablet by mouth as needed.   No facility-administered encounter medications on file as of 07/11/2024.    Surgical History: Past Surgical History:  Procedure Laterality Date   HERNIA REPAIR  06/11/2021   TUBAL LIGATION      Medical History: Past Medical History:  Diagnosis Date   Anemia    Anxiety    Depression    GERD (gastroesophageal reflux disease)    Kidney stone    kidney stone    Family History: Non contributory to the present illness  Social History: Social History   Socioeconomic History   Marital status: Divorced    Spouse name: Not on file   Number of children: 4   Years of education: Not on file   Highest education level: Associate degree: occupational, scientist, product/process development, or vocational program  Occupational History   Not on file  Tobacco Use   Smoking status: Never   Smokeless tobacco: Never  Vaping Use   Vaping status: Never Used  Substance and Sexual Activity   Alcohol use: Yes    Alcohol/week: 2.0 standard drinks of alcohol    Types: 2 Standard drinks or equivalent per week    Comment: occasionally   Drug use: No   Sexual activity: Yes    Birth control/protection: None  Other Topics Concern   Not on file  Social  History Narrative   Lives in [place]; via health care. With son/daughter. No smoking; rare alcohol.    Social Drivers of Corporate Investment Banker Strain: Low Risk  (02/10/2023)   Overall Financial Resource Strain (CARDIA)    Difficulty of Paying Living Expenses: Not very hard  Recent Concern: Financial Resource Strain - Medium Risk (12/02/2022)   Overall Financial Resource Strain (CARDIA)    Difficulty of Paying Living Expenses: Somewhat hard  Food Insecurity: No Food Insecurity (02/10/2023)   Hunger Vital Sign    Worried  About Running Out of Food in the Last Year: Never true    Ran Out of Food in the Last Year: Never true  Transportation Needs: No Transportation Needs (02/10/2023)   PRAPARE - Administrator, Civil Service (Medical): No    Lack of Transportation (Non-Medical): No  Physical Activity: Unknown (02/10/2023)   Exercise Vital Sign    Days of Exercise per Week: 0 days    Minutes of Exercise per Session: Not on file  Recent Concern: Physical Activity - Inactive (02/10/2023)   Exercise Vital Sign    Days of Exercise per Week: 0 days    Minutes of Exercise per Session: 30 min  Stress: No Stress Concern Present (02/10/2023)   [employer] of Occupational Health - Occupational Stress Questionnaire    Feeling of Stress : Only a little  Recent Concern: Stress - Stress Concern Present (12/02/2022)   [employer] of Occupational Health - Occupational Stress Questionnaire    Feeling of Stress : To some extent  Social Connections: Socially Isolated (02/10/2023)   Social Connection and Isolation Panel    Frequency of Communication with Friends and Family: Once a week    Frequency of Social Gatherings with Friends and Family: Once a week    Attends Religious Services: Never    Database Administrator or Organizations: No    Attends Engineer, Structural: Not on file    Marital Status: Divorced  Intimate Partner Violence: Not At Risk (11/18/2022)   Humiliation, Afraid, Rape, and Kick questionnaire    Fear of Current or Ex-Partner: No    Emotionally Abused: No    Physically Abused: No    Sexually Abused: No    Vital Signs: There were no vitals taken for this visit. There is no height or weight on file to calculate BMI.    Examination: General Appearance: The patient is well-developed, well-nourished, and in no distress. Neck Circumference: 40 cm Skin: Gross inspection of skin unremarkable. Head: normocephalic, no gross deformities. Eyes: no gross deformities noted. ENT:  ears appear grossly normal Neurologic: Alert and oriented. No involuntary movements.  STOP BANG RISK ASSESSMENT S (snore) Have you been told that you snore?     NO   T (tired) Are you often tired, fatigued, or sleepy during the day?   YES  O (obstruction) Do you stop breathing, choke, or gasp during sleep? NO   P (pressure) Do you have or are you being treated for high blood pressure? NO   B (BMI) Is your body index greater than 35 kg/m? YES   A (age) Are you 40 years old or older? YES   N (neck) Do you have a neck circumference greater than 16 inches?   NO   G (gender) Are you a female? NO   TOTAL STOP/BANG "YES" ANSWERS 3       A STOP-Bang score of 2 or less is considered low risk, and a  score of 5 or more is high risk for having either moderate or severe OSA. For people who score 3 or 4, doctors may need to perform further assessment to determine how likely they are to have OSA.         EPWORTH SLEEPINESS SCALE:  Scale:  (0)= no chance of dozing; (1)= slight chance of dozing; (2)= moderate chance of dozing; (3)= high chance of dozing  Chance  Situtation    Sitting and reading: 3    Watching TV: 2    Sitting Inactive in public: 0    As a passenger in car: 1      Lying down to rest: 3    Sitting and talking: 0    Sitting quielty after lunch: 3    In a car, stopped in traffic: 0   TOTAL SCORE:   12 out of 24    SLEEP STUDIES:  PSG (09/2021) AHI 7.1/hr, min SpO2 88% Titration (10/2021) CPAP@ 8 cmH2O   CPAP COMPLIANCE DATA:  Date Range: 06/07/2024-07/06/2024  Average Daily Use: 5 hours 24 minutes  Median Use: 5 hours 58 minutes  Compliance for > 4 Hours: 70%  AHI: 0.3 respiratory events per hour  Days Used: 29/30 days  Mask Leak: 11  95th Percentile Pressure: 8         LABS: Recent Results (from the past 2160 hours)  Lactate dehydrogenase     Status: None   Collection Time: 05/18/24  1:26 PM  Result Value Ref Range   LDH 115 98 -  192 U/L    Comment: Performed at [facility], [address] [place], [place] [address]  Ferritin     Status: None   Collection Time: 05/18/24  1:26 PM  Result Value Ref Range   Ferritin 23 11 - 307 ng/mL    Comment: Performed at [facility], [address] [place], [place] [address]  Iron  and TIBC     Status: Abnormal   Collection Time: 05/18/24  1:26 PM  Result Value Ref Range   Iron  44 28 - 170 ug/dL   TIBC 551 749 - 549 ug/dL   Saturation Ratios 10 (L) 10.4 - 31.8 %   UIBC 404 ug/dL    Comment: Performed at [facility], [address] [place], [place] [address]  CBC with Differential (Cancer Center Only)     Status: Abnormal   Collection Time: 05/18/24  1:26 PM  Result Value Ref Range   WBC Count 9.3 4.0 - 10.5 K/uL   RBC 4.83 3.87 - 5.11 MIL/uL   Hemoglobin 12.7 12.0 - 15.0 g/dL   HCT 61.7 63.9 - 53.9 %   MCV 79.1 (L) 80.0 - 100.0 fL   MCH 26.3 26.0 - 34.0 pg   MCHC 33.2 30.0 - 36.0 g/dL   RDW 85.3 88.4 - 84.4 %   Platelet Count 311 150 - 400 K/uL   nRBC 0.0 0.0 - 0.2 %   Neutrophils Relative % 61 %   Neutro Abs 5.6 1.7 - 7.7 K/uL   Lymphocytes Relative 30 %   Lymphs Abs 2.8 0.7 - 4.0 K/uL   Monocytes Relative 7 %   Monocytes Absolute 0.7 0.1 - 1.0 K/uL   Eosinophils Relative 2 %   Eosinophils Absolute 0.2 0.0 - 0.5 K/uL   Basophils Relative 0 %   Basophils Absolute 0.0 0.0 - 0.1 K/uL   Immature Granulocytes 0 %   Abs Immature Granulocytes 0.04 0.00 - 0.07 K/uL  Comment: Performed at [facility], [address] [place], [place] [address]  Basic Metabolic Panel - Cancer Center Only     Status: Abnormal   Collection Time: 05/18/24  1:27 PM  Result Value Ref Range   Sodium 136 135 - 145 mmol/L   Potassium 3.6 3.5 - 5.1 mmol/L   Chloride 105 98 - 111 mmol/L   CO2 24 22 - 32 mmol/L   Glucose, Bld 113 (H) 70 - 99 mg/dL    Comment: Glucose reference range applies only to samples taken after fasting for at least 8 hours.   BUN  9 6 - 20 mg/dL   Creatinine 9.39 9.55 - 1.00 mg/dL   Calcium 9.0 8.9 - 89.6 mg/dL   GFR, Estimated >39 >39 mL/min    Comment: (NOTE) Calculated using the CKD-EPI Creatinine Equation (2021)    Anion gap 7 5 - 15    Comment: Performed at [facility], [address], [place], [place] [address]    Radiology: MM DIAG BREAST TOMO BILATERAL Result Date: 10/09/2021 CLINICAL DATA:  Recall from screening mammography, possible BILATERAL breast masses. EXAM: DIGITAL DIAGNOSTIC BILATERAL MAMMOGRAM WITH TOMOSYNTHESIS AND CAD; ULTRASOUND RIGHT BREAST LIMITED; ULTRASOUND LEFT BREAST LIMITED TECHNIQUE: Bilateral digital diagnostic mammography and breast tomosynthesis was performed. The images were evaluated with computer-aided detection.; Targeted ultrasound examination of the right breast was performed; Targeted ultrasound examination of the left breast was performed. COMPARISON:  Mammography 09/27/2021.  No prior ultrasound. ACR Breast Density Category c: The breast tissue is heterogeneously dense, which may obscure small masses. FINDINGS: Spot-compression CC and MLO views of the areas of concern in both breasts were obtained. RIGHT: Circumscribed isodense mass in the outer breast at anterior to middle depth measuring approximately 1 cm without associated architectural distortion or suspicious calcifications. Circumscribed isodense mass versus focal duct ectasia involving the upper subareolar location. There are there is no associated architectural distortion or suspicious calcifications. Targeted ultrasound is performed, demonstrating an oval parallel anechoic mass with scattered internal echoes at the 9 o'clock position 5 cm from the nipple measuring approximately 1.1 x 0.7 x 0.8 cm, demonstrating posterior acoustic enhancement and no internal power Doppler flow, corresponding to the screening mammographic finding. Mild duct ectasia is present in the upper subareolar location at 12 o'clock. No dominant cyst,  solid mass or abnormal acoustic shadowing is identified. LEFT: Circumscribed isodense mass in the UPPER INNER QUADRANT at middle depth measuring just over 1 cm in size without associated architectural distortion or suspicious calcifications. Targeted ultrasound is performed, demonstrating an oval parallel circumscribed anechoic mass with thin internal septations at the 10 o'clock position 6 cm from the nipple measuring approximately 1.3 x 1.1 x 1.1 cm, demonstrating posterior acoustic enhancement and no internal power Doppler flow, corresponding to the screening mammographic finding. There is a benign cyst immediately adjacent to this dominant cyst. No suspicious solid mass or abnormal acoustic shadowing is identified. IMPRESSION: Benign cysts in both breasts which account for the screening mammographic findings. RECOMMENDATION: Screening mammogram in one year.(Code:SM-B-01Y) I have discussed the findings and recommendations with the patient. If applicable, a reminder letter will be sent to the patient regarding the next appointment. BI-RADS CATEGORY  2: Benign. Electronically Signed   By: Debby Satterfield M.D.   On: 10/09/2021 11:00  US  BREAST LTD UNI LEFT INC AXILLA Result Date: 10/09/2021 CLINICAL DATA:  Recall from screening mammography, possible BILATERAL breast masses. EXAM: DIGITAL DIAGNOSTIC BILATERAL MAMMOGRAM WITH TOMOSYNTHESIS AND CAD; ULTRASOUND RIGHT BREAST LIMITED; ULTRASOUND  LEFT BREAST LIMITED TECHNIQUE: Bilateral digital diagnostic mammography and breast tomosynthesis was performed. The images were evaluated with computer-aided detection.; Targeted ultrasound examination of the right breast was performed; Targeted ultrasound examination of the left breast was performed. COMPARISON:  Mammography 09/27/2021.  No prior ultrasound. ACR Breast Density Category c: The breast tissue is heterogeneously dense, which may obscure small masses. FINDINGS: Spot-compression CC and MLO views of the areas of  concern in both breasts were obtained. RIGHT: Circumscribed isodense mass in the outer breast at anterior to middle depth measuring approximately 1 cm without associated architectural distortion or suspicious calcifications. Circumscribed isodense mass versus focal duct ectasia involving the upper subareolar location. There are there is no associated architectural distortion or suspicious calcifications. Targeted ultrasound is performed, demonstrating an oval parallel anechoic mass with scattered internal echoes at the 9 o'clock position 5 cm from the nipple measuring approximately 1.1 x 0.7 x 0.8 cm, demonstrating posterior acoustic enhancement and no internal power Doppler flow, corresponding to the screening mammographic finding. Mild duct ectasia is present in the upper subareolar location at 12 o'clock. No dominant cyst, solid mass or abnormal acoustic shadowing is identified. LEFT: Circumscribed isodense mass in the UPPER INNER QUADRANT at middle depth measuring just over 1 cm in size without associated architectural distortion or suspicious calcifications. Targeted ultrasound is performed, demonstrating an oval parallel circumscribed anechoic mass with thin internal septations at the 10 o'clock position 6 cm from the nipple measuring approximately 1.3 x 1.1 x 1.1 cm, demonstrating posterior acoustic enhancement and no internal power Doppler flow, corresponding to the screening mammographic finding. There is a benign cyst immediately adjacent to this dominant cyst. No suspicious solid mass or abnormal acoustic shadowing is identified. IMPRESSION: Benign cysts in both breasts which account for the screening mammographic findings. RECOMMENDATION: Screening mammogram in one year.(Code:SM-B-01Y) I have discussed the findings and recommendations with the patient. If applicable, a reminder letter will be sent to the patient regarding the next appointment. BI-RADS CATEGORY  2: Benign. Electronically Signed   By:  Debby Satterfield M.D.   On: 10/09/2021 11:00  US  BREAST LTD UNI RIGHT INC AXILLA Result Date: 10/09/2021 CLINICAL DATA:  Recall from screening mammography, possible BILATERAL breast masses. EXAM: DIGITAL DIAGNOSTIC BILATERAL MAMMOGRAM WITH TOMOSYNTHESIS AND CAD; ULTRASOUND RIGHT BREAST LIMITED; ULTRASOUND LEFT BREAST LIMITED TECHNIQUE: Bilateral digital diagnostic mammography and breast tomosynthesis was performed. The images were evaluated with computer-aided detection.; Targeted ultrasound examination of the right breast was performed; Targeted ultrasound examination of the left breast was performed. COMPARISON:  Mammography 09/27/2021.  No prior ultrasound. ACR Breast Density Category c: The breast tissue is heterogeneously dense, which may obscure small masses. FINDINGS: Spot-compression CC and MLO views of the areas of concern in both breasts were obtained. RIGHT: Circumscribed isodense mass in the outer breast at anterior to middle depth measuring approximately 1 cm without associated architectural distortion or suspicious calcifications. Circumscribed isodense mass versus focal duct ectasia involving the upper subareolar location. There are there is no associated architectural distortion or suspicious calcifications. Targeted ultrasound is performed, demonstrating an oval parallel anechoic mass with scattered internal echoes at the 9 o'clock position 5 cm from the nipple measuring approximately 1.1 x 0.7 x 0.8 cm, demonstrating posterior acoustic enhancement and no internal power Doppler flow, corresponding to the screening mammographic finding. Mild duct ectasia is present in the upper subareolar location at 12 o'clock. No dominant cyst, solid mass or abnormal acoustic shadowing is identified. LEFT: Circumscribed isodense mass in the UPPER INNER QUADRANT  at middle depth measuring just over 1 cm in size without associated architectural distortion or suspicious calcifications. Targeted ultrasound is  performed, demonstrating an oval parallel circumscribed anechoic mass with thin internal septations at the 10 o'clock position 6 cm from the nipple measuring approximately 1.3 x 1.1 x 1.1 cm, demonstrating posterior acoustic enhancement and no internal power Doppler flow, corresponding to the screening mammographic finding. There is a benign cyst immediately adjacent to this dominant cyst. No suspicious solid mass or abnormal acoustic shadowing is identified. IMPRESSION: Benign cysts in both breasts which account for the screening mammographic findings. RECOMMENDATION: Screening mammogram in one year.(Code:SM-B-01Y) I have discussed the findings and recommendations with the patient. If applicable, a reminder letter will be sent to the patient regarding the next appointment. BI-RADS CATEGORY  2: Benign. Electronically Signed   By: Debby Satterfield M.D.   On: 10/09/2021 11:00   No results found.  No results found.    Assessment and Plan: Patient Active Problem List   Diagnosis Date Noted   OSA on CPAP 01/06/2022   CPAP use counseling 01/06/2022   Pre-diabetes 08/15/2021   Symptomatic anemia 08/15/2021   Morbid obesity (HCC) 08/15/2021   Anxiety 03/20/2016   Major depressive disorder, recurrent, in partial remission 03/20/2016   Hyperlipidemia, unspecified 03/20/2016    1. OSA on CPAP (Primary) The patient does tolerate PAP and reports  benefit from PAP use. The patient was reminded how to clean equipment and advised to replace supplies regularly. The patient was also counselled on weight loss. Given her morbid obesity and sleep apnea she would be a good candidate for Zepbound . SABRA The compliance is good. The AHI is 0.3.   OSA on cpap- controlled. Continue with excellent compliance with pap. CPAP continues to be medically necessary to treat this patient's OSA. F/u one year.    2. CPAP use counseling CPAP Counseling: had a lengthy discussion with the patient regarding the importance of PAP  therapy in management of the sleep apnea. Patient appears to understand the risk factor reduction and also understands the risks associated with untreated sleep apnea. Patient will try to make a good faith effort to remain compliant with therapy. Also instructed the patient on proper cleaning of the device including the water must be changed daily if possible and use of distilled water is preferred. Patient understands that the machine should be regularly cleaned with appropriate recommended cleaning solutions that do not damage the PAP machine for example given white vinegar and water rinses. Other methods such as ozone treatment may not be as good as these simple methods to achieve cleaning.   3. Morbid obesity (HCC) Obesity Counseling: Had a lengthy discussion regarding patients BMI and weight issues. Patient was instructed on portion control as well as increased activity. Also discussed caloric restrictions with trying to maintain intake less than 2000 Kcal. Discussions were made in accordance with the 5As of weight management. Simple actions such as not eating late and if able to, taking a walk is suggested.     General Counseling: I have discussed the findings of the evaluation and examination with Beth [name].  I have also discussed any further diagnostic evaluation thatmay be needed or ordered today. Beth [name] verbalizes understanding of the findings of todays visit. We also reviewed her medications today and discussed drug interactions and side effects including but not limited excessive drowsiness and altered mental states. We also discussed that there is always a risk not just to her but also people around her. she  has been encouraged to call the office with any questions or concerns that should arise related to todays visit.  No orders of the defined types were placed in this encounter.       I have personally obtained a history, examined the patient, evaluated laboratory and imaging results,  formulated the assessment and plan and placed orders. This patient was seen today by Lauraine Lay, PA-C in collaboration with Dr. Elfreda Bathe.   Elfreda DELENA Bathe, MD [facility] Diplomate ABMS Pulmonary Critical Care Medicine and Sleep Medicine

## 2024-07-10 ENCOUNTER — Other Ambulatory Visit: Payer: Self-pay | Admitting: Internal Medicine

## 2024-07-11 ENCOUNTER — Encounter: Payer: Self-pay | Admitting: Internal Medicine

## 2024-07-11 ENCOUNTER — Ambulatory Visit (INDEPENDENT_AMBULATORY_CARE_PROVIDER_SITE_OTHER): Admitting: Internal Medicine

## 2024-07-11 VITALS — BP 119/90 | HR 93 | Resp 16 | Ht 62.0 in | Wt 246.0 lb

## 2024-07-11 DIAGNOSIS — Z7189 Other specified counseling: Secondary | ICD-10-CM | POA: Diagnosis not present

## 2024-07-11 DIAGNOSIS — G4733 Obstructive sleep apnea (adult) (pediatric): Secondary | ICD-10-CM | POA: Diagnosis not present

## 2024-07-11 NOTE — Patient Instructions (Signed)

## 2024-11-16 ENCOUNTER — Other Ambulatory Visit

## 2024-11-16 ENCOUNTER — Ambulatory Visit: Admitting: Internal Medicine

## 2024-11-16 ENCOUNTER — Ambulatory Visit
